# Patient Record
Sex: Female | Born: 1990 | Race: White | Hispanic: No | Marital: Single | Smoking: Current some day smoker
Health system: Southern US, Community
[De-identification: ages and names within clinical notes are randomized; demographics above are authoritative.]

## PROBLEM LIST (undated history)

## (undated) DIAGNOSIS — F1910 Other psychoactive substance abuse, uncomplicated: Secondary | ICD-10-CM

## (undated) DIAGNOSIS — F419 Anxiety disorder, unspecified: Secondary | ICD-10-CM

## (undated) DIAGNOSIS — A5901 Trichomonal vulvovaginitis: Secondary | ICD-10-CM

## (undated) DIAGNOSIS — M719 Bursopathy, unspecified: Secondary | ICD-10-CM

## (undated) DIAGNOSIS — M5136 Other intervertebral disc degeneration, lumbar region: Secondary | ICD-10-CM

## (undated) DIAGNOSIS — G43909 Migraine, unspecified, not intractable, without status migrainosus: Secondary | ICD-10-CM

## (undated) DIAGNOSIS — M51369 Other intervertebral disc degeneration, lumbar region without mention of lumbar back pain or lower extremity pain: Secondary | ICD-10-CM

## (undated) DIAGNOSIS — F319 Bipolar disorder, unspecified: Secondary | ICD-10-CM

## (undated) DIAGNOSIS — M5126 Other intervertebral disc displacement, lumbar region: Secondary | ICD-10-CM

## (undated) DIAGNOSIS — Z87442 Personal history of urinary calculi: Secondary | ICD-10-CM

## (undated) DIAGNOSIS — N289 Disorder of kidney and ureter, unspecified: Secondary | ICD-10-CM

## (undated) HISTORY — DX: Migraine, unspecified, not intractable, without status migrainosus: G43.909

## (undated) HISTORY — DX: Anxiety disorder, unspecified: F41.9

## (undated) HISTORY — DX: Trichomonal vulvovaginitis: A59.01

## (undated) HISTORY — DX: Bipolar disorder, unspecified: F31.9

## (undated) HISTORY — DX: Other psychoactive substance abuse, uncomplicated: F19.10

---
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
## 2011-02-14 ENCOUNTER — Emergency Department: Payer: Self-pay | Admitting: Internal Medicine

## 2011-05-18 ENCOUNTER — Emergency Department: Payer: Self-pay | Admitting: *Deleted

## 2011-09-09 DIAGNOSIS — M797 Fibromyalgia: Secondary | ICD-10-CM | POA: Insufficient documentation

## 2011-10-21 ENCOUNTER — Ambulatory Visit: Payer: Self-pay | Admitting: Family Medicine

## 2012-01-11 ENCOUNTER — Emergency Department: Payer: Self-pay | Admitting: *Deleted

## 2012-03-01 ENCOUNTER — Emergency Department: Payer: Self-pay | Admitting: Emergency Medicine

## 2012-03-09 DIAGNOSIS — M549 Dorsalgia, unspecified: Secondary | ICD-10-CM | POA: Insufficient documentation

## 2012-11-19 ENCOUNTER — Emergency Department: Payer: Self-pay | Admitting: Emergency Medicine

## 2012-11-19 LAB — CBC
HCT: 45.5 % (ref 35.0–47.0)
HGB: 15.4 g/dL (ref 12.0–16.0)
MCH: 31.6 pg (ref 26.0–34.0)
MCHC: 34 g/dL (ref 32.0–36.0)
MCV: 93 fL (ref 80–100)
Platelet: 311 10*3/uL (ref 150–440)
RBC: 4.89 10*6/uL (ref 3.80–5.20)
RDW: 13.2 % (ref 11.5–14.5)
WBC: 25.5 10*3/uL — ABNORMAL HIGH (ref 3.6–11.0)

## 2012-11-19 LAB — BASIC METABOLIC PANEL
Anion Gap: 9 (ref 7–16)
BUN: 9 mg/dL (ref 7–18)
Calcium, Total: 9.9 mg/dL (ref 8.5–10.1)
Chloride: 105 mmol/L (ref 98–107)
Co2: 23 mmol/L (ref 21–32)
Creatinine: 0.83 mg/dL (ref 0.60–1.30)
EGFR (African American): >60
EGFR (Non-African Amer.): >60
Glucose: 123 mg/dL — ABNORMAL HIGH (ref 65–99)
Osmolality: 274 (ref 275–301)
Potassium: 3.8 mmol/L (ref 3.5–5.1)
Sodium: 137 mmol/L (ref 136–145)

## 2012-11-19 LAB — URINALYSIS, COMPLETE
Bilirubin,UR: NEGATIVE
Glucose,UR: NEGATIVE mg/dL (ref 0–75)
Ketone: NEGATIVE
Nitrite: POSITIVE
Ph: 9 (ref 4.5–8.0)
Protein: >=500
RBC,UR: 377 /HPF (ref 0–5)
Specific Gravity: 1.016 (ref 1.003–1.030)
Squamous Epithelial: NONE SEEN
WBC UR: 934 /HPF (ref 0–5)

## 2012-11-22 LAB — URINE CULTURE

## 2012-11-25 LAB — CULTURE, BLOOD (SINGLE)

## 2013-04-09 ENCOUNTER — Emergency Department: Payer: Self-pay | Admitting: Emergency Medicine

## 2013-04-09 LAB — URINALYSIS, COMPLETE
Bilirubin,UR: NEGATIVE
Blood: NEGATIVE
Glucose,UR: NEGATIVE mg/dL (ref 0–75)
Ketone: NEGATIVE
Nitrite: NEGATIVE
Ph: 6 (ref 4.5–8.0)
Protein: NEGATIVE
RBC,UR: 4 /HPF (ref 0–5)
Specific Gravity: 1.023 (ref 1.003–1.030)
Squamous Epithelial: 2
WBC UR: 12 /HPF (ref 0–5)

## 2013-04-09 LAB — WET PREP, GENITAL

## 2013-04-09 LAB — GC/CHLAMYDIA PROBE AMP

## 2013-04-10 DIAGNOSIS — A5901 Trichomonal vulvovaginitis: Secondary | ICD-10-CM

## 2013-04-10 HISTORY — DX: Trichomonal vulvovaginitis: A59.01

## 2013-11-15 DIAGNOSIS — F4322 Adjustment disorder with anxiety: Secondary | ICD-10-CM | POA: Insufficient documentation

## 2014-07-13 DIAGNOSIS — F1321 Sedative, hypnotic or anxiolytic dependence, in remission: Secondary | ICD-10-CM | POA: Insufficient documentation

## 2014-09-11 DIAGNOSIS — Z98891 History of uterine scar from previous surgery: Secondary | ICD-10-CM | POA: Insufficient documentation

## 2015-07-12 ENCOUNTER — Emergency Department: Payer: Medicaid Other

## 2015-07-12 ENCOUNTER — Emergency Department
Admission: EM | Admit: 2015-07-12 | Discharge: 2015-07-13 | Disposition: A | Discharge status: Home / Self Care | Payer: Medicaid Other | Attending: Emergency Medicine | Admitting: Emergency Medicine

## 2015-07-12 ENCOUNTER — Encounter: Payer: Self-pay | Admitting: *Deleted

## 2015-07-12 DIAGNOSIS — Z880 Allergy status to penicillin: Secondary | ICD-10-CM | POA: Diagnosis not present

## 2015-07-12 DIAGNOSIS — Y9289 Other specified places as the place of occurrence of the external cause: Secondary | ICD-10-CM | POA: Diagnosis not present

## 2015-07-12 DIAGNOSIS — S60222A Contusion of left hand, initial encounter: Secondary | ICD-10-CM | POA: Diagnosis not present

## 2015-07-12 DIAGNOSIS — S0501XA Injury of conjunctiva and corneal abrasion without foreign body, right eye, initial encounter: Secondary | ICD-10-CM | POA: Insufficient documentation

## 2015-07-12 DIAGNOSIS — S40022A Contusion of left upper arm, initial encounter: Secondary | ICD-10-CM | POA: Diagnosis not present

## 2015-07-12 DIAGNOSIS — Y9389 Activity, other specified: Secondary | ICD-10-CM | POA: Insufficient documentation

## 2015-07-12 DIAGNOSIS — Y998 Other external cause status: Secondary | ICD-10-CM | POA: Insufficient documentation

## 2015-07-12 DIAGNOSIS — S40021A Contusion of right upper arm, initial encounter: Secondary | ICD-10-CM | POA: Diagnosis not present

## 2015-07-12 DIAGNOSIS — Z91040 Latex allergy status: Secondary | ICD-10-CM | POA: Insufficient documentation

## 2015-07-12 DIAGNOSIS — S60221A Contusion of right hand, initial encounter: Secondary | ICD-10-CM | POA: Insufficient documentation

## 2015-07-12 DIAGNOSIS — S0591XA Unspecified injury of right eye and orbit, initial encounter: Secondary | ICD-10-CM | POA: Diagnosis present

## 2015-07-12 DIAGNOSIS — S0990XA Unspecified injury of head, initial encounter: Secondary | ICD-10-CM | POA: Diagnosis not present

## 2015-07-12 MED ORDER — TETRACAINE HCL 0.5 % OP SOLN
2.0000 [drp] | Freq: Once | OPHTHALMIC | Status: AC
  Administered 2015-07-13: 2 [drp] via OPHTHALMIC
  Filled 2015-07-12: qty 2

## 2015-07-12 MED ORDER — FLUORESCEIN SODIUM 1 MG OP STRP
1.0000 | ORAL_STRIP | Freq: Once | OPHTHALMIC | Status: AC
  Administered 2015-07-13: 1 via OPHTHALMIC
  Filled 2015-07-12: qty 1
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
## 2015-07-13 MED ORDER — KETOROLAC TROMETHAMINE 0.5 % OP SOLN: 1.0000 [drp] | Freq: Four times a day (QID) | OPHTHALMIC | Status: DC

## 2015-07-13 MED ORDER — OXYCODONE-ACETAMINOPHEN 5-325 MG PO TABS
1.0000 | ORAL_TABLET | Freq: Once | ORAL | Status: AC
  Administered 2015-07-13: 1 via ORAL
  Filled 2015-07-13: qty 1

## 2015-07-13 MED ORDER — MELOXICAM 15 MG PO TABS
15.0000 mg | ORAL_TABLET | Freq: Every day | ORAL | Status: DC
Start: 2015-07-13 — End: 2016-04-15

## 2015-07-13 MED ORDER — POLYMYXIN B-TRIMETHOPRIM 10000-0.1 UNIT/ML-% OP SOLN: 2.0000 [drp] | Freq: Four times a day (QID) | OPHTHALMIC | Status: DC

## 2015-07-13 MED ORDER — HYDROCODONE-ACETAMINOPHEN 5-325 MG PO TABS
1.0000 | ORAL_TABLET | Freq: Once | ORAL | Status: DC
  Filled 2015-07-13: qty 1
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
## 2015-07-13 NOTE — Discharge Instructions (Signed)
Contusion °A contusion is a deep bruise. Contusions are the result of a blunt injury to tissues and muscle fibers under the skin. The injury causes bleeding under the skin. The skin overlying the contusion may turn blue, purple, or yellow. Minor injuries will give you a painless contusion, but more severe contusions may stay painful and swollen for a few weeks.  °CAUSES  °This condition is usually caused by a blow, trauma, or direct force to an area of the body. °SYMPTOMS  °Symptoms of this condition include: °· Swelling of the injured area. °· Pain and tenderness in the injured area. °· Discoloration. The area may have redness and then turn blue, purple, or yellow. °DIAGNOSIS  °This condition is diagnosed based on a physical exam and medical history. An X-ray, CT scan, or MRI may be needed to determine if there are any associated injuries, such as broken bones (fractures). °TREATMENT  °Specific treatment for this condition depends on what area of the body was injured. In general, the best treatment for a contusion is resting, icing, applying pressure to (compression), and elevating the injured area. This is often called the RICE strategy. Over-the-counter anti-inflammatory medicines may also be recommended for pain control.  °HOME CARE INSTRUCTIONS  °· Rest the injured area. °· If directed, apply ice to the injured area: °· Put ice in a plastic bag. °· Place a towel between your skin and the bag. °· Leave the ice on for 20 minutes, 2-3 times per day. °· If directed, apply light compression to the injured area using an elastic bandage. Make sure the bandage is not wrapped too tightly. Remove and reapply the bandage as directed by your health care provider. °· If possible, raise (elevate) the injured area above the level of your heart while you are sitting or lying down. °· Take over-the-counter and prescription medicines only as told by your health care provider. °SEEK MEDICAL CARE IF: °· Your symptoms do not  improve after several days of treatment. °· Your symptoms get worse. °· You have difficulty moving the injured area. °SEEK IMMEDIATE MEDICAL CARE IF:  °· You have severe pain. °· You have numbness in a hand or foot. °· Your hand or foot turns pale or cold. °  °This information is not intended to replace advice given to you by your health care provider. Make sure you discuss any questions you have with your health care provider. °  °Document Released: 03/06/2005 Document Revised: 02/15/2015 Document Reviewed: 10/12/2014 °Elsevier Interactive Patient Education ©2016 [employer] ° °Corneal Abrasion °The cornea is the clear covering at the front and center of the eye. When looking at the colored portion of the eye (iris), you are looking through the cornea. This very thin tissue is made up of many layers. The surface layer is a single layer of cells (corneal epithelium) and is one of the most sensitive tissues in the body. If a scratch or injury causes the corneal epithelium to come off, it is called a corneal abrasion. If the injury extends to the tissues below the epithelium, the condition is called a corneal ulcer. °CAUSES  °· Scratches. °· Trauma. °· Foreign body in the eye. °Some people have recurrences of abrasions in the area of the original injury even after it has healed (recurrent erosion syndrome). Recurrent erosion syndrome generally improves and goes away with time. °SYMPTOMS  °· Eye pain. °· Difficulty or inability to keep the injured eye open. °· The eye becomes very sensitive to light. °· Recurrent   erosions tend to happen suddenly, first thing in the morning, usually after waking up and opening the eye. °DIAGNOSIS  °Your health care provider can diagnose a corneal abrasion during an eye exam. Dye is usually placed in the eye using a drop or a small paper strip moistened by your tears. When the eye is examined with a special light, the abrasion shows up clearly because of the dye. °TREATMENT  °· Small  abrasions may be treated with antibiotic drops or ointment alone. °· A pressure patch may be put over the eye. If this is done, follow your doctor's instructions for when to remove the patch. Do not drive or use machines while the eye patch is on. Judging distances is hard to do with a patch on. °If the abrasion becomes infected and spreads to the deeper tissues of the cornea, a corneal ulcer can result. This is serious because it can cause corneal scarring. Corneal scars interfere with light passing through the cornea and cause a loss of vision in the involved eye. °HOME CARE INSTRUCTIONS °· Use medicine or ointment as directed. Only take over-the-counter or prescription medicines for pain, discomfort, or fever as directed by your health care provider. °· Do not drive or operate machinery if your eye is patched. Your ability to judge distances is impaired. °· If your health care provider has given you a follow-up appointment, it is very important to keep that appointment. Not keeping the appointment could result in a severe eye infection or permanent loss of vision. If there is any problem keeping the appointment, let your health care provider know. °SEEK MEDICAL CARE IF:  °· You have pain, light sensitivity, and a scratchy feeling in one eye or both eyes. °· Your pressure patch keeps loosening up, and you can blink your eye under the patch after treatment. °· Any kind of discharge develops from the eye after treatment or if the lids stick together in the morning. °· You have the same symptoms in the morning as you did with the original abrasion days, weeks, or months after the abrasion healed. °  °This information is not intended to replace advice given to you by your health care provider. Make sure you discuss any questions you have with your health care provider. °  °Document Released: 05/24/2000 Document Revised: 02/15/2015 Document Reviewed: 02/01/2013 °Elsevier Interactive Patient Education ©2016 [employer] ° °

## 2015-07-21 ENCOUNTER — Emergency Department
Admission: EM | Admit: 2015-07-21 | Discharge: 2015-07-22 | Disposition: A | Discharge status: Home / Self Care | Payer: Medicaid Other | Attending: Emergency Medicine | Admitting: Emergency Medicine

## 2015-07-21 ENCOUNTER — Encounter: Payer: Self-pay | Admitting: Emergency Medicine

## 2015-07-21 DIAGNOSIS — S060X9D Concussion with loss of consciousness of unspecified duration, subsequent encounter: Secondary | ICD-10-CM | POA: Insufficient documentation

## 2015-07-21 DIAGNOSIS — F10129 Alcohol abuse with intoxication, unspecified: Secondary | ICD-10-CM | POA: Diagnosis not present

## 2015-07-21 DIAGNOSIS — Z880 Allergy status to penicillin: Secondary | ICD-10-CM | POA: Diagnosis not present

## 2015-07-21 DIAGNOSIS — Z791 Long term (current) use of non-steroidal anti-inflammatories (NSAID): Secondary | ICD-10-CM | POA: Insufficient documentation

## 2015-07-21 DIAGNOSIS — S060X9A Concussion with loss of consciousness of unspecified duration, initial encounter: Secondary | ICD-10-CM

## 2015-07-21 DIAGNOSIS — Z91040 Latex allergy status: Secondary | ICD-10-CM | POA: Insufficient documentation

## 2015-07-21 DIAGNOSIS — F17210 Nicotine dependence, cigarettes, uncomplicated: Secondary | ICD-10-CM | POA: Insufficient documentation

## 2015-07-21 DIAGNOSIS — Z79899 Other long term (current) drug therapy: Secondary | ICD-10-CM | POA: Diagnosis not present

## 2015-07-21 DIAGNOSIS — R51 Headache: Secondary | ICD-10-CM | POA: Diagnosis present

## 2015-07-21 DIAGNOSIS — F1410 Cocaine abuse, uncomplicated: Secondary | ICD-10-CM | POA: Insufficient documentation

## 2015-07-21 HISTORY — DX: Other intervertebral disc displacement, lumbar region: M51.26

## 2015-07-21 HISTORY — DX: Bursopathy, unspecified: M71.9

## 2015-07-21 HISTORY — DX: Other intervertebral disc degeneration, lumbar region: M51.36

## 2015-07-21 HISTORY — DX: Other intervertebral disc degeneration, lumbar region without mention of lumbar back pain or lower extremity pain: M51.369

## 2015-07-21 HISTORY — DX: Disorder of kidney and ureter, unspecified: N28.9
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
## 2015-07-22 NOTE — Discharge Instructions (Signed)
Concussion, Adult  A concussion, or closed-head injury, is a brain injury caused by a direct blow to the head or by a quick and sudden movement (jolt) of the head or neck. Concussions are usually not life-threatening. Even so, the effects of a concussion can be serious. If you have had a concussion before, you are more likely to experience concussion-like symptoms after a direct blow to the head.   CAUSES  · Direct blow to the head, such as from running into another player during a soccer game, being hit in a fight, or hitting your head on a hard surface.  · A jolt of the head or neck that causes the brain to move back and forth inside the skull, such as in a car crash.  SIGNS AND SYMPTOMS  The signs of a concussion can be hard to notice. Early on, they may be missed by you, family members, and health care providers. You may look fine but act or feel differently.  Symptoms are usually temporary, but they may last for days, weeks, or even longer. Some symptoms may appear right away while others may not show up for hours or days. Every head injury is different. Symptoms include:  · Mild to moderate headaches that will not go away.  · A feeling of pressure inside your head.  · Having more trouble than usual:    Learning or remembering things you have heard.    Answering questions.    Paying attention or concentrating.    Organizing daily tasks.    Making decisions and solving problems.  · Slowness in thinking, acting or reacting, speaking, or reading.  · Getting lost or being easily confused.  · Feeling tired all the time or lacking energy (fatigued).  · Feeling drowsy.  · Sleep disturbances.    Sleeping more than usual.    Sleeping less than usual.    Trouble falling asleep.    Trouble sleeping (insomnia).  · Loss of balance or feeling lightheaded or dizzy.  · Nausea or vomiting.  · Numbness or tingling.  · Increased sensitivity to:    Sounds.    Lights.    Distractions.  · Vision problems or eyes that tire  easily.  · Diminished sense of taste or smell.  · Ringing in the ears.  · Mood changes such as feeling sad or anxious.  · Becoming easily irritated or angry for little or no reason.  · Lack of motivation.  · Seeing or hearing things other people do not see or hear (hallucinations).  DIAGNOSIS  Your health care provider can usually diagnose a concussion based on a description of your injury and symptoms. He or she will ask whether you passed out (lost consciousness) and whether you are having trouble remembering events that happened right before and during your injury.  Your evaluation might include:  · A brain scan to look for signs of injury to the brain. Even if the test shows no injury, you may still have a concussion.  · Blood tests to be sure other problems are not present.  TREATMENT  · Concussions are usually treated in an emergency department, in urgent care, or at a clinic. You may need to stay in the hospital overnight for further treatment.  · Tell your health care provider if you are taking any medicines, including prescription medicines, over-the-counter medicines, and natural remedies. Some medicines, such as blood thinners (anticoagulants) and aspirin, may increase the chance of complications. Also tell your health care   provider whether you have had alcohol or are taking illegal drugs. This information may affect treatment.  · Your health care provider will send you home with important instructions to follow.  · How fast you will recover from a concussion depends on many factors. These factors include how severe your concussion is, what part of your brain was injured, your age, and how healthy you were before the concussion.  · Most people with mild injuries recover fully. Recovery can take time. In general, recovery is slower in older persons. Also, persons who have had a concussion in the past or have other medical problems may find that it takes longer to recover from their current injury.  HOME  CARE INSTRUCTIONS  General Instructions  · Carefully follow the directions your health care provider gave you.  · Only take over-the-counter or prescription medicines for pain, discomfort, or fever as directed by your health care provider.  · Take only those medicines that your health care provider has approved.  · Do not drink alcohol until your health care provider says you are well enough to do so. Alcohol and certain other drugs may slow your recovery and can put you at risk of further injury.  · If it is harder than usual to remember things, write them down.  · If you are easily distracted, try to do one thing at a time. For example, do not try to watch TV while fixing dinner.  · Talk with family members or close friends when making important decisions.  · Keep all follow-up appointments. Repeated evaluation of your symptoms is recommended for your recovery.  · Watch your symptoms and tell others to do the same. Complications sometimes occur after a concussion. Older adults with a brain injury may have a higher risk of serious complications, such as a blood clot on the brain.  · Tell your teachers, school nurse, school counselor, coach, athletic trainer, or work manager about your injury, symptoms, and restrictions. Tell them about what you can or cannot do. They should watch for:    Increased problems with attention or concentration.    Increased difficulty remembering or learning new information.    Increased time needed to complete tasks or assignments.    Increased irritability or decreased ability to cope with stress.    Increased symptoms.  · Rest. Rest helps the brain to heal. Make sure you:    Get plenty of sleep at night. Avoid staying up late at night.    Keep the same bedtime hours on weekends and weekdays.    Rest during the day. Take daytime naps or rest breaks when you feel tired.  · Limit activities that require a lot of thought or concentration. These include:    Doing homework or job-related  work.    Watching TV.    Working on the computer.  · Avoid any situation where there is potential for another head injury (football, hockey, soccer, basketball, martial arts, downhill snow sports and horseback riding). Your condition will get worse every time you experience a concussion. You should avoid these activities until you are evaluated by the appropriate follow-up health care providers.  Returning To Your Regular Activities  You will need to return to your normal activities slowly, not all at once. You must give your body and brain enough time for recovery.  · Do not return to sports or other athletic activities until your health care provider tells you it is safe to do so.  · Ask   your health care provider when you can drive, ride a bicycle, or operate heavy machinery. Your ability to react may be slower after a brain injury. Never do these activities if you are dizzy.  · Ask your health care provider about when you can return to work or school.  Preventing Another Concussion  It is very important to avoid another brain injury, especially before you have recovered. In rare cases, another injury can lead to permanent brain damage, brain swelling, or death. The risk of this is greatest during the first 7-10 days after a head injury. Avoid injuries by:  · Wearing a seat belt when riding in a car.  · Drinking alcohol only in moderation.  · Wearing a helmet when biking, skiing, skateboarding, skating, or doing similar activities.  · Avoiding activities that could lead to a second concussion, such as contact or recreational sports, until your health care provider says it is okay.  · Taking safety measures in your home.    Remove clutter and tripping hazards from floors and stairways.    Use grab bars in bathrooms and handrails by stairs.    Place non-slip mats on floors and in bathtubs.    Improve lighting in dim areas.  SEEK MEDICAL CARE IF:  · You have increased problems paying attention or  concentrating.  · You have increased difficulty remembering or learning new information.  · You need more time to complete tasks or assignments than before.  · You have increased irritability or decreased ability to cope with stress.  · You have more symptoms than before.  Seek medical care if you have any of the following symptoms for more than 2 weeks after your injury:  · Lasting (chronic) headaches.  · Dizziness or balance problems.  · Nausea.  · Vision problems.  · Increased sensitivity to noise or light.  · Depression or mood swings.  · Anxiety or irritability.  · Memory problems.  · Difficulty concentrating or paying attention.  · Sleep problems.  · Feeling tired all the time.  SEEK IMMEDIATE MEDICAL CARE IF:  · You have severe or worsening headaches. These may be a sign of a blood clot in the brain.  · You have weakness (even if only in one hand, leg, or part of the face).  · You have numbness.  · You have decreased coordination.  · You vomit repeatedly.  · You have increased sleepiness.  · One pupil is larger than the other.  · You have convulsions.  · You have slurred speech.  · You have increased confusion. This may be a sign of a blood clot in the brain.  · You have increased restlessness, agitation, or irritability.  · You are unable to recognize people or places.  · You have neck pain.  · It is difficult to wake you up.  · You have unusual behavior changes.  · You lose consciousness.  MAKE SURE YOU:  · Understand these instructions.  · Will watch your condition.  · Will get help right away if you are not doing well or get worse.     This information is not intended to replace advice given to you by your health care provider. Make sure you discuss any questions you have with your health care provider.     Document Released: 08/17/2003 Document Revised: 06/17/2014 Document Reviewed: 12/17/2012  Elsevier Interactive Patient Education ©2016 [employer]

## 2015-07-22 NOTE — ED Notes (Signed)
Upon entering room pt. Sleeping in bed.  Pt. States she has had HA and blurry vision for the past 5-6 days.  Pt. Also complains of body aches.  Pt. States she was dx with a coronal abrasion.

## 2015-07-22 NOTE — ED Notes (Signed)
Pt. States she was unable to pick up medications for eye due to cost.

## 2015-07-22 NOTE — ED Notes (Signed)
Pt. Requested to use phone after doctor examination.  After phone call pt. Stated she needed to go home.  Pt. Stated, her childs father would be picking her up in waiting room to take her home.

## 2015-07-22 NOTE — ED Notes (Signed)
Pt. Stated she did not need discharge instructions.
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
## 2016-02-08 ENCOUNTER — Ambulatory Visit: Payer: Medicaid Other | Admitting: Licensed Clinical Social Worker

## 2016-04-01 ENCOUNTER — Telehealth: Payer: Self-pay

## 2016-04-01 NOTE — Telephone Encounter (Signed)
Pt calls and states that she has an appointment with is Wednesday for NOB intake but today has noticed some pink colored discharge after using the bathroom. Pt states that she is concerned because she did not have this in her previous pregnancies. Pt denies pain. Advised pt on implantation bleeding as well as first trimester bleeding. Advised pt to seek care in the ED if she experienced severe cramping or period like bleeding.

## 2016-04-15 ENCOUNTER — Ambulatory Visit (INDEPENDENT_AMBULATORY_CARE_PROVIDER_SITE_OTHER): Payer: Medicaid Other | Admitting: Obstetrics and Gynecology

## 2016-04-15 VITALS — BP 115/65 | HR 83 | Wt 143.3 lb

## 2016-04-15 DIAGNOSIS — Z1389 Encounter for screening for other disorder: Secondary | ICD-10-CM

## 2016-04-15 DIAGNOSIS — Z3492 Encounter for supervision of normal pregnancy, unspecified, second trimester: Secondary | ICD-10-CM

## 2016-04-15 DIAGNOSIS — Z331 Pregnant state, incidental: Secondary | ICD-10-CM

## 2016-04-15 DIAGNOSIS — N912 Amenorrhea, unspecified: Secondary | ICD-10-CM

## 2016-04-15 DIAGNOSIS — Z3687 Encounter for antenatal screening for uncertain dates: Secondary | ICD-10-CM

## 2016-04-15 DIAGNOSIS — Z369 Encounter for antenatal screening, unspecified: Secondary | ICD-10-CM

## 2016-04-15 DIAGNOSIS — Z113 Encounter for screening for infections with a predominantly sexual mode of transmission: Secondary | ICD-10-CM

## 2016-04-15 LAB — POCT URINE PREGNANCY: Preg Test, Ur: POSITIVE — AB

## 2016-04-15 NOTE — Progress Notes (Signed)
Linda Thornton presents for NOB nurse interview visit. Pregnancy confirmation done at [facility] but did not bring it with her. UPT: POSITIVE. This was done here at office.  G-3.  P-2002  Pregnancy education material explained and given. No cats in the home. NOB labs ordered.  HIV labs and Drug screen were explained optional and she did not decline. Drug screen ordered.  PNV encouraged. MaternIT genetic test done today. Pt's maternal aunt has a child with cerebral palsy and he is deaf. FOB previous wife had a still birth at term.  Pt. To follow up with provider in as soon as available for NOB physical.  Pt had previously rescheduled x2 and now she is 12.4wks today by LMP: 01/18/2016 (approximate). Ultrasound ordered for dating and viability because pt is unsure of LMP. All questions answered.

## 2016-04-15 NOTE — Patient Instructions (Signed)
Pregnancy and Zika Virus Disease Zika virus disease, or Zika, is an illness that can spread to people from mosquitoes that carry the virus. It may also spread from person to person through infected body fluids. Zika first occurred in [place], but recently it has spread to new areas. The virus occurs in tropical climates. The location of Zika continues to change. Most people who become infected with Zika virus do not develop serious illness. However, Zika may cause birth defects in an unborn baby whose mother is infected with the virus. It may also increase the risk of miscarriage. WHAT ARE THE SYMPTOMS OF ZIKA VIRUS DISEASE? In many cases, people who have been infected with Zika virus do not develop any symptoms. If symptoms appear, they usually start about a week after the person is infected. Symptoms are usually mild. They may include:  Fever.  Rash.  Red eyes.  Joint pain. HOW DOES ZIKA VIRUS DISEASE SPREAD? The main way that Zika virus spreads is through the bite of a certain type of mosquito. Unlike most types of mosquitos, which bite only at night, the type of mosquito that carries Zika virus bites both at night and during the day. Zika virus can also spread through sexual contact, through a blood transfusion, and from a mother to her baby before or during birth. Once you have had Zika virus disease, it is unlikely that you will get it again. CAN I PASS ZIKA TO MY BABY DURING PREGNANCY? Yes, Zika can pass from a mother to her baby before or during birth. WHAT PROBLEMS CAN ZIKA CAUSE FOR MY BABY? A woman who is infected with Zika virus while pregnant is at risk of having her baby born with a condition in which the brain or head is smaller than expected (microcephaly). Babies who have microcephaly can have developmental delays, seizures, hearing problems, and vision problems. Having Zika virus disease during pregnancy can also increase the risk of miscarriage. HOW CAN ZIKA VIRUS DISEASE BE  PREVENTED? There is no vaccine to prevent Zika. The best way to prevent the disease is to avoid infected mosquitoes and avoid exposure to body fluids that can spread the virus. Avoid any possible exposure to Zika by taking the following precautions. For women and their sex partners:  Avoid traveling to high-risk areas. The locations where Zika is being reported change often. To identify high-risk areas, check the CDC travel website: www.cdc.gov/zika/geo/index.html  If you or your sex partner must travel to a high-risk area, talk with a health care provider before and after traveling.  Take all precautions to avoid mosquito bites if you live in, or travel to, any of the high-risk areas. Insect repellents are safe to use during pregnancy.  Ask your health care provider when it is safe to have sexual contact. For women:  If you are pregnant or trying to become pregnant, avoid sexual contact with persons who may have been exposed to Zika virus, persons who have possible symptoms of Zika, or persons whose history you are unsure about. If you choose to have sexual contact with someone who may have been exposed to Zika virus, use condoms correctly during the entire duration of sexual activity, every time. Do not share sexual devices, as you may be exposed to body fluids.  Ask your health care provider about when it is safe to attempt pregnancy after a possible exposure to Zika virus. WHAT STEPS SHOULD I TAKE TO AVOID MOSQUITO BITES? Take these steps to avoid mosquito bites when you are   in a high-risk area:  Wear loose clothing that covers your arms and legs.  Limit your outdoor activities.  Do not open windows unless they have window screens.  Sleep under mosquito nets.  Use insect repellent. The best insect repellents have:  DEET, picaridin, oil of lemon eucalyptus (OLE), or [id number] in them.  Higher amounts of an active ingredient in them.  Remember that insect repellents are safe to use  during pregnancy.  Do not use OLE on children who are younger than 3 years of age. Do not use insect repellent on babies who are younger than 2 months of age.  Cover your child's stroller with mosquito netting. Make sure the netting fits snugly and that any loose netting does not cover your child's mouth or nose. Do not use a blanket as a mosquito-protection cover.  Do not apply insect repellent underneath clothing.  If you are using sunscreen, apply the sunscreen before applying the insect repellent.  Treat clothing with permethrin. Do not apply permethrin directly to your skin. Follow label directions for safe use.  Get rid of standing water, where mosquitoes may reproduce. Standing water is often found in items such as buckets, bowls, animal food dishes, and flowerpots. When you return from traveling to any high-risk area, continue taking actions to protect yourself against mosquito bites for 3 weeks, even if you show no signs of illness. This will prevent spreading Zika virus to uninfected mosquitoes. WHAT SHOULD I KNOW ABOUT THE SEXUAL TRANSMISSION OF ZIKA? People can spread Zika to their sexual partners during vaginal, anal, or oral sex, or by sharing sexual devices. Many people with Zika do not develop symptoms, so a person could spread the disease without knowing that they are infected. The greatest risk is to women who are pregnant or who may become pregnant. Zika virus can live longer in semen than it can live in blood. Couples can prevent sexual transmission of the virus by:  Using condoms correctly during the entire duration of sexual activity, every time. This includes vaginal, anal, and oral sex.  Not sharing sexual devices. Sharing increases your risk of being exposed to body fluid from another person.  Avoiding all sexual activity until your health care provider says it is safe. SHOULD I BE TESTED FOR ZIKA VIRUS? A sample of your blood can be tested for Zika virus. A pregnant  woman should be tested if she may have been exposed to the virus or if she has symptoms of Zika. She may also have additional tests done during her pregnancy, such ultrasound testing. Talk with your health care provider about which tests are recommended.   This information is not intended to replace advice given to you by your health care provider. Make sure you discuss any questions you have with your health care provider.   Document Released: 02/15/2015 Document Reviewed: 02/08/2015 Elsevier Interactive Patient Education 2016 [employer] Minor Illnesses and Medications in Pregnancy  Cold/Flu:  Sudafed for congestion- Robitussin (plain) for cough- Tylenol for discomfort.  Please follow the directions on the label.  Try not to take any more than needed.  OTC Saline nasal spray and air humidifier or cool-mist  Vaporizer to sooth nasal irritation and to loosen congestion.  It is also important to increase intake of non carbonated fluids, especially if you have a fever.  Constipation:  Colace-2 capsules at bedtime; Metamucil- follow directions on label; Senokot- 1 tablet at bedtime.  Any one of these medications can be used.  It is also   very important to increase fluids and fruits along with regular exercise.  If problem persists please call the office.  Diarrhea:  Kaopectate as directed on the label.  Eat a bland diet and increase fluids.  Avoid highly seasoned foods.  Headache:  Tylenol 1 or 2 tablets every 3-4 hours as needed  Indigestion:  Maalox, Mylanta, Tums or Rolaids- as directed on label.  Also try to eat small meals and avoid fatty, greasy or spicy foods.  Nausea with or without Vomiting:  Nausea in pregnancy is caused by increased levels of hormones in the body which influence the digestive system and cause irritation when stomach acids accumulate.  Symptoms usually subside after 1st trimester of pregnancy.  Try the following: 1. Keep saltines, graham crackers or dry toast by your bed  to eat upon awakening. 2. Don't let your stomach get empty.  Try to eat 5-6 small meals per day instead of 3 large ones. 3. Avoid greasy fatty or highly seasoned foods.  4. Take OTC Unisom 1 tablet at bed time along with OTC Vitamin B6 25-50 mg 3 times per day.    If nausea continues with vomiting and you are unable to keep down food and fluids you may need a prescription medication.  Please notify your provider.   Sore throat:  Chloraseptic spray, throat lozenges and or plain Tylenol.  Vaginal Yeast Infection:  OTC Monistat for 7 days as directed on label.  If symptoms do not resolve within a week notify provider.  If any of the above problems do not subside with recommended treatment please call the office for further assistance.   Do not take Aspirin, Advil, Motrin or Ibuprofen.  * * OTC= Over the counter Hyperemesis Gravidarum Hyperemesis gravidarum is a severe form of nausea and vomiting that happens during pregnancy. Hyperemesis is worse than morning sickness. It may cause you to have nausea or vomiting all day for many days. It may keep you from eating and drinking enough food and liquids. Hyperemesis usually occurs during the first half (the first 20 weeks) of pregnancy. It often goes away once a woman is in her second half of pregnancy. However, sometimes hyperemesis continues through an entire pregnancy.  CAUSES  The cause of this condition is not completely known but is thought to be related to changes in the body's hormones when pregnant. It could be from the high level of the pregnancy hormone or an increase in estrogen in the body.  SIGNS AND SYMPTOMS   Severe nausea and vomiting.  Nausea that does not go away.  Vomiting that does not allow you to keep any food down.  Weight loss and body fluid loss (dehydration).  Having no desire to eat or not liking food you have previously enjoyed. DIAGNOSIS  Your health care provider will do a physical exam and ask you about your  symptoms. He or she may also order blood tests and urine tests to make sure something else is not causing the problem.  TREATMENT  You may only need medicine to control the problem. If medicines do not control the nausea and vomiting, you will be treated in the hospital to prevent dehydration, increased acid in the blood (acidosis), weight loss, and changes in the electrolytes in your body that may harm the unborn baby (fetus). You may need IV fluids.  HOME CARE INSTRUCTIONS   Only take over-the-counter or prescription medicines as directed by your health care provider.  Try eating a couple of dry crackers or   toast in the morning before getting out of bed.  Avoid foods and smells that upset your stomach.  Avoid fatty and spicy foods.  Eat 5-6 small meals a day.  Do not drink when eating meals. Drink between meals.  For snacks, eat high-protein foods, such as cheese.  Eat or suck on things that have ginger in them. Ginger helps nausea.  Avoid food preparation. The smell of food can spoil your appetite.  Avoid iron pills and iron in your multivitamins until after 3-4 months of being pregnant. However, consult with your health care provider before stopping any prescribed iron pills. SEEK MEDICAL CARE IF:   Your abdominal pain increases.  You have a severe headache.  You have vision problems.  You are losing weight. SEEK IMMEDIATE MEDICAL CARE IF:   You are unable to keep fluids down.  You vomit blood.  You have constant nausea and vomiting.  You have excessive weakness.  You have extreme thirst.  You have dizziness or fainting.  You have a fever or persistent symptoms for more than 2-3 days.  You have a fever and your symptoms suddenly get worse. MAKE SURE YOU:   Understand these instructions.  Will watch your condition.  Will get help right away if you are not doing well or get worse.   This information is not intended to replace advice given to you by your  health care provider. Make sure you discuss any questions you have with your health care provider.   Document Released: 05/27/2005 Document Revised: 03/17/2013 Document Reviewed: 01/06/2013 Elsevier Interactive Patient Education 2016 [employer] Commonly Asked Questions During Pregnancy  Cats: A parasite can be excreted in cat feces.  To avoid exposure you need to have another person empty the little box.  If you must empty the litter box you will need to wear gloves.  Wash your hands after handling your cat.  This parasite can also be found in raw or undercooked meat so this should also be avoided.  Colds, Sore Throats, Flu: Please check your medication sheet to see what you can take for symptoms.  If your symptoms are unrelieved by these medications please call the office.  Dental Work: Most any dental work your dentist recommends is permitted.  X-rays should only be taken during the first trimester if absolutely necessary.  Your abdomen should be shielded with a lead apron during all x-rays.  Please notify your provider prior to receiving any x-rays.  Novocaine is fine; gas is not recommended.  If your dentist requires a note from us prior to dental work please call the office and we will provide one for you.  Exercise: Exercise is an important part of staying healthy during your pregnancy.  You may continue most exercises you were accustomed to prior to pregnancy.  Later in your pregnancy you will most likely notice you have difficulty with activities requiring balance like riding a bicycle.  It is important that you listen to your body and avoid activities that put you at a higher risk of falling.  Adequate rest and staying well hydrated are a must!  If you have questions about the safety of specific activities ask your provider.    Exposure to Children with illness: Try to avoid obvious exposure; report any symptoms to us when noted,  If you have chicken pos, red measles or mumps, you should  be immune to these diseases.   Please do not take any vaccines while pregnant unless you have checked with   your OB provider.  Fetal Movement: After 28 weeks we recommend you do "kick counts" twice daily.  Lie or sit down in a calm quiet environment and count your baby movements "kicks".  You should feel your baby at least 10 times per hour.  If you have not felt 10 kicks within the first hour get up, walk around and have something sweet to eat or drink then repeat for an additional hour.  If count remains less than 10 per hour notify your provider.  Fumigating: Follow your pest control agent's advice as to how long to stay out of your home.  Ventilate the area well before re-entering.  Hemorrhoids:   Most over-the-counter preparations can be used during pregnancy.  Check your medication to see what is safe to use.  It is important to use a stool softener or fiber in your diet and to drink lots of liquids.  If hemorrhoids seem to be getting worse please call the office.   Hot Tubs:  Hot tubs Jacuzzis and saunas are not recommended while pregnant.  These increase your internal body temperature and should be avoided.  Intercourse:  Sexual intercourse is safe during pregnancy as long as you are comfortable, unless otherwise advised by your provider.  Spotting may occur after intercourse; report any bright red bleeding that is heavier than spotting.  Labor:  If you know that you are in labor, please go to the hospital.  If you are unsure, please call the office and let us help you decide what to do.  Lifting, straining, etc:  If your job requires heavy lifting or straining please check with your provider for any limitations.  Generally, you should not lift items heavier than that you can lift simply with your hands and arms (no back muscles)  Painting:  Paint fumes do not harm your pregnancy, but may make you ill and should be avoided if possible.  Latex or water based paints have less odor than oils.   Use adequate ventilation while painting.  Permanents & Hair Color:  Chemicals in hair dyes are not recommended as they cause increase hair dryness which can increase hair loss during pregnancy.  " Highlighting" and permanents are allowed.  Dye may be absorbed differently and permanents may not hold as well during pregnancy.  Sunbathing:  Use a sunscreen, as skin burns easily during pregnancy.  Drink plenty of fluids; avoid over heating.  Tanning Beds:  Because their possible side effects are still unknown, tanning beds are not recommended.  Ultrasound Scans:  Routine ultrasounds are performed at approximately 20 weeks.  You will be able to see your baby's general anatomy an if you would like to know the gender this can usually be determined as well.  If it is questionable when you conceived you may also receive an ultrasound early in your pregnancy for dating purposes.  Otherwise ultrasound exams are not routinely performed unless there is a medical necessity.  Although you can request a scan we ask that you pay for it when conducted because insurance does not cover " patient request" scans.  Work: If your pregnancy proceeds without complications you may work until your due date, unless your physician or employer advises otherwise.  Round Ligament Pain/Pelvic Discomfort:  Sharp, shooting pains not associated with bleeding are fairly common, usually occurring in the second trimester of pregnancy.  They tend to be worse when standing up or when you remain standing for long periods of time.  These are the result   of pressure of certain pelvic ligaments called "round ligaments".  Rest, Tylenol and heat seem to be the most effective relief.  As the womb and fetus grow, they rise out of the pelvis and the discomfort improves.  Please notify the office if your pain seems different than that described.  It may represent a more serious condition.   

## 2016-04-16 ENCOUNTER — Encounter: Payer: Self-pay | Admitting: Obstetrics and Gynecology

## 2016-04-16 LAB — CBC WITH DIFFERENTIAL/PLATELET
Basophils Absolute: 0 10*3/uL (ref 0.0–0.2)
Basos: 0 %
EOS (ABSOLUTE): 0.1 10*3/uL (ref 0.0–0.4)
Eos: 1 %
Hematocrit: 37.8 % (ref 34.0–46.6)
Hemoglobin: 12.9 g/dL (ref 11.1–15.9)
Immature Grans (Abs): 0 10*3/uL (ref 0.0–0.1)
Immature Granulocytes: 0 %
Lymphocytes Absolute: 2.2 10*3/uL (ref 0.7–3.1)
Lymphs: 17 %
MCH: 31 pg (ref 26.6–33.0)
MCHC: 34.1 g/dL (ref 31.5–35.7)
MCV: 91 fL (ref 79–97)
Monocytes Absolute: 0.8 10*3/uL (ref 0.1–0.9)
Monocytes: 6 %
Neutrophils Absolute: 10.1 10*3/uL — ABNORMAL HIGH (ref 1.4–7.0)
Neutrophils: 76 %
Platelets: 287 10*3/uL (ref 150–379)
RBC: 4.16 x10E6/uL (ref 3.77–5.28)
RDW: 13.5 % (ref 12.3–15.4)
WBC: 13.2 10*3/uL — ABNORMAL HIGH (ref 3.4–10.8)

## 2016-04-16 LAB — GC/CHLAMYDIA PROBE AMP
Chlamydia trachomatis, NAA: NEGATIVE
Neisseria gonorrhoeae by PCR: NEGATIVE

## 2016-04-16 LAB — RUBELLA SCREEN: Rubella Antibodies, IGG: 2.19 index (ref >0.99)

## 2016-04-16 LAB — ABO

## 2016-04-16 LAB — ANTIBODY SCREEN: Antibody Screen: NEGATIVE

## 2016-04-16 LAB — HEPATITIS B SURFACE ANTIGEN: Hepatitis B Surface Ag: NEGATIVE

## 2016-04-16 LAB — RH TYPE: Rh Factor: POSITIVE

## 2016-04-16 LAB — VARICELLA ZOSTER ANTIBODY, IGG: Varicella zoster IgG: 450 index (ref >165)

## 2016-04-16 LAB — RPR: RPR Ser Ql: NONREACTIVE

## 2016-04-16 LAB — HIV ANTIBODY (ROUTINE TESTING W REFLEX): HIV Screen 4th Generation wRfx: NONREACTIVE

## 2016-04-17 LAB — URINALYSIS, ROUTINE W REFLEX MICROSCOPIC
Bilirubin, UA: NEGATIVE
Glucose, UA: NEGATIVE
Ketones, UA: NEGATIVE
Leukocytes, UA: NEGATIVE
Nitrite, UA: NEGATIVE
Protein, UA: NEGATIVE
RBC, UA: NEGATIVE
Specific Gravity, UA: 1.021 (ref 1.005–1.030)
Urobilinogen, Ur: 0.2 mg/dL (ref 0.2–1.0)
pH, UA: 7.5 (ref 5.0–7.5)

## 2016-04-17 LAB — MONITOR DRUG PROFILE 14(MW)
Amphetamine Scrn, Ur: NEGATIVE ng/mL
BARBITURATE SCREEN URINE: NEGATIVE ng/mL
BENZODIAZEPINE SCREEN, URINE: NEGATIVE ng/mL
Buprenorphine, Urine: NEGATIVE ng/mL
CANNABINOIDS UR QL SCN: NEGATIVE ng/mL
Cocaine (Metab) Scrn, Ur: NEGATIVE ng/mL
Creatinine(Crt), U: 130.8 mg/dL (ref 20.0–300.0)
Fentanyl, Urine: NEGATIVE pg/mL
Meperidine Screen, Urine: NEGATIVE ng/mL
Methadone Screen, Urine: NEGATIVE ng/mL
OXYCODONE+OXYMORPHONE UR QL SCN: NEGATIVE ng/mL
Opiate Scrn, Ur: NEGATIVE ng/mL
Ph of Urine: 7.2 (ref 4.5–8.9)
Phencyclidine Qn, Ur: NEGATIVE ng/mL
Propoxyphene Scrn, Ur: NEGATIVE ng/mL
SPECIFIC GRAVITY: 1.02
Tramadol Screen, Urine: NEGATIVE ng/mL

## 2016-04-17 LAB — URINE CULTURE, OB REFLEX

## 2016-04-17 LAB — NICOTINE SCREEN, URINE: Cotinine Ql Scrn, Ur: POSITIVE ng/mL

## 2016-04-17 LAB — CULTURE, OB URINE

## 2016-04-18 ENCOUNTER — Telehealth: Payer: Self-pay | Admitting: Obstetrics and Gynecology

## 2016-04-18 NOTE — Telephone Encounter (Signed)
Called pt she questions what her varicella results mean, advised pt that she is immune to the virus based on her labs. Pt gave verbal understanding

## 2016-04-18 NOTE — Telephone Encounter (Signed)
Pt called and she saw her labs on her my chart and she had a question about the varacella results, she hasn't seen dr Valentino Saxoncherry yet but she will be.

## 2016-04-23 ENCOUNTER — Other Ambulatory Visit: Payer: Self-pay

## 2016-04-23 ENCOUNTER — Telehealth: Payer: Self-pay | Admitting: Obstetrics and Gynecology

## 2016-04-23 NOTE — Telephone Encounter (Signed)
Pt called and she was looking at her my chart message and she saw that there were a lot of results that are back and she was wanting the results she could not tell on her my chart what the results are.

## 2016-04-23 NOTE — Telephone Encounter (Signed)
Called pt no answer. LM for pt to call back.  

## 2016-04-23 NOTE — Telephone Encounter (Signed)
Pt calls back questions if MaterniT results are back, advised pt they were not and we would call her as soon as we get them in. Pt gave verbal understanding.

## 2016-04-24 LAB — MATERNIT21  PLUS CORE+ESS+SCA, BLOOD
Chromosome 13: NEGATIVE
Chromosome 18: NEGATIVE
Chromosome 21: NEGATIVE
Y Chromosome: DETECTED

## 2016-04-25 ENCOUNTER — Ambulatory Visit (INDEPENDENT_AMBULATORY_CARE_PROVIDER_SITE_OTHER): Payer: Medicaid Other | Admitting: Obstetrics and Gynecology

## 2016-04-25 ENCOUNTER — Telehealth: Payer: Self-pay | Admitting: Obstetrics and Gynecology

## 2016-04-25 ENCOUNTER — Ambulatory Visit (INDEPENDENT_AMBULATORY_CARE_PROVIDER_SITE_OTHER): Payer: Medicaid Other

## 2016-04-25 VITALS — BP 107/66 | HR 86 | Wt 150.5 lb

## 2016-04-25 DIAGNOSIS — Z124 Encounter for screening for malignant neoplasm of cervix: Secondary | ICD-10-CM

## 2016-04-25 DIAGNOSIS — Z720 Tobacco use: Secondary | ICD-10-CM

## 2016-04-25 DIAGNOSIS — F4322 Adjustment disorder with anxiety: Secondary | ICD-10-CM

## 2016-04-25 DIAGNOSIS — Z3482 Encounter for supervision of other normal pregnancy, second trimester: Secondary | ICD-10-CM | POA: Diagnosis not present

## 2016-04-25 DIAGNOSIS — Z369 Encounter for antenatal screening, unspecified: Secondary | ICD-10-CM

## 2016-04-25 DIAGNOSIS — O2601 Excessive weight gain in pregnancy, first trimester: Secondary | ICD-10-CM

## 2016-04-25 DIAGNOSIS — E663 Overweight: Secondary | ICD-10-CM

## 2016-04-25 DIAGNOSIS — Z98891 History of uterine scar from previous surgery: Secondary | ICD-10-CM

## 2016-04-25 DIAGNOSIS — Z3687 Encounter for antenatal screening for uncertain dates: Secondary | ICD-10-CM | POA: Diagnosis not present

## 2016-04-25 DIAGNOSIS — Z3682 Encounter for antenatal screening for nuchal translucency: Secondary | ICD-10-CM | POA: Diagnosis not present

## 2016-04-25 LAB — POCT URINALYSIS DIPSTICK
Bilirubin, UA: NEGATIVE
Blood, UA: NEGATIVE
Glucose, UA: NEGATIVE
Ketones, UA: NEGATIVE
Leukocytes, UA: NEGATIVE
Nitrite, UA: NEGATIVE
Protein, UA: NEGATIVE
Spec Grav, UA: 1.01
Urobilinogen, UA: NEGATIVE
pH, UA: 7

## 2016-04-25 NOTE — Progress Notes (Signed)
OBSTETRIC INITIAL PRENATAL VISIT  Subjective:    Linda Thornton is being seen today for her first obstetrical visit.  This is a planned pregnancy. She is a 25 y.o. G59P2002 female at [redacted]w[redacted]d gestation, Estimated Date of Delivery: 10/24/16 with last menstrual period 01/18/2016 (approximate). Her obstetrical history is significant for prior C-section x 2. Relationship with FOB: significant other, living together. Patient does intend to breast feed. Pregnancy history fully reviewed.   Obstetric History   G3   P2   T2   P0   A0   L2    SAB0   TAB0   Ectopic0   Multiple0   Live Births2     # Outcome Date GA Lbr Len/2nd Weight Sex Delivery Anes PTL Lv  3 Current           2 Term 10/12/14 [redacted]w[redacted]d  7 lb 6 oz (3.345 kg) M CS-Unspec  N LIV  1 Term 05/26/09 [redacted]w[redacted]d  7 lb 6.5 oz (3.359 kg) M CS-Unspec  N LIV    Obstetric Comments  G1 - C-section for FTP (only dilated 1.5 cm, failed IOL).   G2- performed for repeat only    Gynecologic History:  Last pap smear was ~ 2013 or 2014.  Results were normal.  Denies h/o abnormal pap smears in the past.  Denies history of STIs.  Contraception: None   Past Medical History:  Diagnosis Date  . Bulging lumbar disc   . Bursitis   . Migraine headache   . Renal disorder    kidney stone     Family History  Problem Relation Age of Onset  . Irregular heart beat Mother   . Hypertension Mother   . Migraines Mother   . Rheum arthritis Maternal Grandmother   . Osteoarthritis Maternal Grandmother   . Diabetes Maternal Grandfather   . Rheum arthritis Maternal Grandfather   . Osteoarthritis Maternal Grandfather      Past Surgical History:  Procedure Laterality Date  . CESAREAN SECTION      Social History   Social History  . Marital status: Single    Spouse name: N/A  . Number of children: N/A  . Years of education: N/A   Occupational History  . Not on file.   Social History Main Topics  . Smoking status: Current Some Day Smoker   Packs/day: 0.50    Types: Cigarettes  . Smokeless tobacco: Never Used  . Alcohol use No  . Drug use: No  . Sexual activity: Yes    Partners: Male    Birth control/ protection: OCP     Comment: tri-low sprintec   Other Topics Concern  . Not on file   Social History Narrative  . No narrative on file     Current Outpatient Prescriptions on File Prior to Visit  Medication Sig Dispense Refill  . Prenatal Multivit-Min-Fe-FA (PRENATAL VITAMINS PO) Take 1 tablet by mouth daily.     No current facility-administered medications on file prior to visit.      Allergies  Allergen Reactions  . Hydrocodone-Acetaminophen Itching  . Latex Rash  . Penicillins Rash     Review of Systems General:Not Present- Fever, Weight Loss and Weight Gain. Skin:Not Present- Rash. HEENT:Not Present- Blurred Vision, Headache and Bleeding Gums. Respiratory:Not Present- Difficulty Breathing. Breast:Not Present- Breast Mass. Cardiovascular:Not Present- Chest Pain, Elevated Blood Pressure, Fainting / Blacking Out and Shortness of Breath. Gastrointestinal: Present - Nausea (mild). Not Present- Abdominal Pain, Constipation, and Vomiting. Female Genitourinary:Not Present- Frequency,  Painful Urination, Pelvic Pain, Vaginal Bleeding, Vaginal Discharge, Contractions, regular, Fetal Movements Decreased, Urinary Complaints and Vaginal Fluid. Musculoskeletal:Not Present- Back Pain and Leg Cramps. Neurological:Not Present- Dizziness. Psychiatric:Not Present- Depression.     Objective:   Blood pressure 107/66, pulse 86, weight 150 lb 8 oz (68.3 kg), last menstrual period 01/18/2016.  Body mass index is 28.44 kg/m.  General Appearance:    Alert, cooperative, no distress, appears stated age, overweight  Head:    Normocephalic, without obvious abnormality, atraumatic  Eyes:    PERRL, conjunctiva/corneas clear, EOM's intact, both eyes  Ears:    Normal external ear canals, both ears  Nose:   Nares  normal, septum midline, mucosa normal, no drainage or sinus tenderness  Throat:   Lips, mucosa, and tongue normal; teeth and gums normal  Neck:   Supple, symmetrical, trachea midline, no adenopathy; thyroid: no enlargement/tenderness/nodules; no carotid bruit or JVD  Back:     Symmetric, no curvature, ROM normal, no CVA tenderness  Lungs:     Clear to auscultation bilaterally, respirations unlabored  Chest Wall:    No tenderness or deformity   Heart:    Regular rate and rhythm, S1 and S2 normal, no murmur, rub or gallop  Breast Exam:    No tenderness, masses, or nipple abnormality  Abdomen:     Soft, non-tender, bowel sounds active all four quadrants, no masses, no organomegaly.  FH 14.  FHT 162 bpm.  Well healed Pfannenstiel incision.   Genitalia:    Pelvic:external genitalia normal, vagina without lesions, discharge, or tenderness, rectovaginal septum  normal. Cervix normal in appearance, no cervical motion tenderness, no adnexal masses or tenderness.  Pregnancy positive findings: uterine enlargement: 13-14 wk size, nontender.   Rectal:    Normal external sphincter.  No hemorrhoids appreciated. Internal exam not done.   Extremities:   Extremities normal, atraumatic, no cyanosis or edema  Pulses:   2+ and symmetric all extremities  Skin:   Skin color, texture, turgor normal, no rashes or lesions  Lymph nodes:   Cervical, supraclavicular, and axillary nodes normal  Neurologic:   CNII-XII intact, normal strength, sensation and reflexes throughout    Assessment:   Pregnancy at 14 and 2/7 weeks   Overweight H/o prior C-section x 2 Tobacco abuse H/o anxiety Excessive weight gain in 1st trimester  Plan:   1. Pregnancy at 14 and 2/7 weeks  - Initial labs reviewed. - Pap smear performed today. - Prenatal vitamins encouraged. - Problem list reviewed and updated. - New OB counseling:  The patient has been given an overview regarding routine prenatal care.   - Prenatal testing, optional  genetic testing, and ultrasound use in pregnancy were reviewed.  Ultrasound ordered to confirm dating. - MaterniT21 previously performed, normal female.  - Benefits of Breast Feeding were discussed. The patient is encouraged to consider nursing her baby post partum.  2. Overweight  - Recommendations regarding diet, weight gain, and exercise in pregnancy were given.  Patient has already gained ~ 20 lbs since onset of pregnancy.    3. H/o prior C-section x 2 - Reviewed reason for h/o C-sections, initial performed due to failed IOL at 41 weeks (pt only dilated 1.5 cm).  Second performed for h/o prior C-section.  Discussed risks vs benefits of TOLAC vs repeat C-section, noted decreased rate of success based on current history, however patient still desires a trial. Handouts given.  Will continue to discuss during later portion of pregnancy.  4. Tobacco abuse - Has cut  down to 2-3 cig/day (down from 1/2 ppd).  Continued to encourage cessation.   5. H/o anxiety - Patient d/c'd anxiety meds after onset of pregnancy.  Will monitor symptoms durin pregnancy  Follow up in 4 weeks.  Medicaid Home Pregnancy forms completed today.   50% of 30 min visit spent on counseling and coordination of care.    Hildred LaserAnika Maika Kaczmarek, MD [facility]

## 2016-04-25 NOTE — Telephone Encounter (Signed)
Edson SnowballOki this girl had surgery and had a catherter in and it was pulled out on Tuesday. She has been in extreme pain since. I told her to call the office that did surgery and pulled the cath out. They need to take care of this. Im sending this just so it is noted what I said.

## 2016-04-26 NOTE — Telephone Encounter (Signed)
Linda Thornton, I'm pretty sure this is the wrong pt. This pt is [redacted]wks pregnant we just saw her yesterday, she has not had surgery. Can you send a message on the correct pt? Thanks

## 2016-04-27 ENCOUNTER — Encounter: Payer: Self-pay | Admitting: Obstetrics and Gynecology

## 2016-04-27 DIAGNOSIS — Z720 Tobacco use: Secondary | ICD-10-CM | POA: Insufficient documentation

## 2016-04-27 DIAGNOSIS — E663 Overweight: Secondary | ICD-10-CM | POA: Insufficient documentation

## 2016-05-03 LAB — PAP IG, CT-NG, RFX HPV ASCU
Chlamydia, Nuc. Acid Amp: NEGATIVE
Gonococcus by Nucleic Acid Amp: NEGATIVE
PAP Smear Comment: 0

## 2016-05-11 ENCOUNTER — Encounter: Payer: Self-pay | Admitting: Emergency Medicine

## 2016-05-11 ENCOUNTER — Emergency Department
Admission: EM | Admit: 2016-05-11 | Discharge: 2016-05-11 | Disposition: A | Discharge status: Home / Self Care | Payer: Medicaid Other | Attending: Emergency Medicine | Admitting: Emergency Medicine

## 2016-05-11 DIAGNOSIS — O23592 Infection of other part of genital tract in pregnancy, second trimester: Secondary | ICD-10-CM | POA: Insufficient documentation

## 2016-05-11 DIAGNOSIS — B9689 Other specified bacterial agents as the cause of diseases classified elsewhere: Secondary | ICD-10-CM

## 2016-05-11 DIAGNOSIS — G43009 Migraine without aura, not intractable, without status migrainosus: Secondary | ICD-10-CM | POA: Diagnosis not present

## 2016-05-11 DIAGNOSIS — Z91040 Latex allergy status: Secondary | ICD-10-CM | POA: Insufficient documentation

## 2016-05-11 DIAGNOSIS — N760 Acute vaginitis: Secondary | ICD-10-CM

## 2016-05-11 DIAGNOSIS — Z3A16 16 weeks gestation of pregnancy: Secondary | ICD-10-CM | POA: Diagnosis not present

## 2016-05-11 DIAGNOSIS — O99352 Diseases of the nervous system complicating pregnancy, second trimester: Secondary | ICD-10-CM | POA: Diagnosis not present

## 2016-05-11 LAB — CBC WITH DIFFERENTIAL/PLATELET
Basophils Absolute: 0 10*3/uL (ref 0–0.1)
Basophils Relative: 1 %
Eosinophils Absolute: 0 10*3/uL (ref 0–0.7)
Eosinophils Relative: 1 %
HCT: 29.2 % — ABNORMAL LOW (ref 35.0–47.0)
Hemoglobin: 10.2 g/dL — ABNORMAL LOW (ref 12.0–16.0)
Lymphocytes Relative: 14 %
Lymphs Abs: 0.7 10*3/uL — ABNORMAL LOW (ref 1.0–3.6)
MCH: 31.8 pg (ref 26.0–34.0)
MCHC: 34.8 g/dL (ref 32.0–36.0)
MCV: 91.4 fL (ref 80.0–100.0)
Monocytes Absolute: 0.7 10*3/uL (ref 0.2–0.9)
Monocytes Relative: 14 %
Neutro Abs: 3.8 10*3/uL (ref 1.4–6.5)
Neutrophils Relative %: 72 %
Platelets: 154 10*3/uL (ref 150–440)
RBC: 3.2 MIL/uL — ABNORMAL LOW (ref 3.80–5.20)
RDW: 13.5 % (ref 11.5–14.5)
WBC: 5.3 10*3/uL (ref 3.6–11.0)

## 2016-05-11 LAB — WET PREP, GENITAL
Sperm: NONE SEEN
Trich, Wet Prep: NONE SEEN
Yeast Wet Prep HPF POC: NONE SEEN

## 2016-05-11 LAB — BASIC METABOLIC PANEL
Anion gap: 7 (ref 5–15)
BUN: 9 mg/dL (ref 6–20)
CO2: 21 mmol/L — ABNORMAL LOW (ref 22–32)
Calcium: 8.3 mg/dL — ABNORMAL LOW (ref 8.9–10.3)
Chloride: 108 mmol/L (ref 101–111)
Creatinine, Ser: 0.55 mg/dL (ref 0.44–1.00)
GFR calc Af Amer: >60 mL/min (ref >60)
GFR calc non Af Amer: >60 mL/min (ref >60)
Glucose, Bld: 89 mg/dL (ref 65–99)
Potassium: 3.3 mmol/L — ABNORMAL LOW (ref 3.5–5.1)
Sodium: 136 mmol/L (ref 135–145)

## 2016-05-11 MED ORDER — KETOROLAC TROMETHAMINE 30 MG/ML IJ SOLN
15.0000 mg | Freq: Once | INTRAMUSCULAR | Status: AC
  Administered 2016-05-11: 15 mg via INTRAVENOUS
  Filled 2016-05-11: qty 1

## 2016-05-11 MED ORDER — DIPHENHYDRAMINE HCL 50 MG/ML IJ SOLN
25.0000 mg | Freq: Once | INTRAMUSCULAR | Status: AC
  Administered 2016-05-11: 25 mg via INTRAVENOUS
  Filled 2016-05-11: qty 1

## 2016-05-11 MED ORDER — SODIUM CHLORIDE 0.9 % IV BOLUS (SEPSIS)
500.0000 mL | INTRAVENOUS | Status: AC
  Administered 2016-05-11: 500 mL via INTRAVENOUS

## 2016-05-11 MED ORDER — METRONIDAZOLE 500 MG PO TABS: 500.0000 mg | ORAL_TABLET | Freq: Two times a day (BID) | ORAL | 0 refills | Status: DC

## 2016-05-11 MED ORDER — MAGNESIUM SULFATE 2 GM/50ML IV SOLN
2.0000 g | Freq: Once | INTRAVENOUS | Status: AC
  Administered 2016-05-11: 2 g via INTRAVENOUS
  Filled 2016-05-11: qty 50

## 2016-05-11 MED ORDER — PROCHLORPERAZINE EDISYLATE 5 MG/ML IJ SOLN
10.0000 mg | INTRAMUSCULAR | Status: AC
  Administered 2016-05-11: 10 mg via INTRAVENOUS
  Filled 2016-05-11: qty 2

## 2016-05-11 NOTE — ED Triage Notes (Signed)
Patient to ER for pain to vaginal/clitoral area. Patient states it felt like a burning pain, began two days ago. Patient states she tried anti-fungal cream with no relief. Has discomfort to lower abdomen and discomfort when urinating, but denies burning with urination. Patient is approx [redacted] weeks pregnant.

## 2016-05-11 NOTE — ED Notes (Signed)
MD Forbach at bedside. 

## 2016-05-11 NOTE — Discharge Instructions (Signed)
You have been seen in the Emergency Department (ED) for a migraine.  Please use Tylenol or Motrin as needed for symptoms, but only as written on the box, and take any regular medications that have been prescribed for you. As we have discussed, please follow up with your doctor as soon as possible regarding today?s ED visit and your headache symptoms.    Additionally, your pelvic exam was reassuring.  You have a mild infection called bacterial vaginosis that is NOT sexually transmitted and is very common.  We provided a prescription which should treat the condition.  Please follow up with your OB provider at the next available opportunity.  Call your doctor or return to the Emergency Department (ED) if you have a worsening headache, sudden and severe headache, confusion, slurred speech, facial droop, weakness or numbness in any arm or leg, extreme fatigue, or other symptoms that concern you.

## 2016-05-11 NOTE — ED Notes (Signed)
Pt verbalized understanding of discharge instructions. NAD at this time. 

## 2016-05-11 NOTE — ED Provider Notes (Signed)
[facility] Emergency Department Provider Note  ____________________________________________   First MD Initiated Contact with Patient 05/11/16 1310     (approximate)  I have reviewed the triage vital signs and the nursing notes.   HISTORY  Chief Complaint Vaginal Pain and Abdominal Pain    HPI ASIANAE MINKLER is a 25 y.o. female who is [redacted] weeks pregnant and followed by in [facility].  She presents for evaluation of essentially 2 separate processes, she has had a severe, gradual in onset, global headache with photosensitivity and nausea for the last several days.  She states that this does not feel exactly like her chronic migraines but is similar.  She has had no numbness or tingling or weakness in any of her extremities and no visual disturbances.  She states in general she feels "bad all over".  Her female partner has had a recent viral illness that they thought might be the flu.  She reports some body aches all over.  She denies fever/chills, chest pain, shortness of breath, facial pain, sore throat, vomiting, diarrhea.  Additionally she has had pain in the upper part of her "vaginal area" for the last 2 days.  She states that it is burning severely and that it was not helped with any over-the-counter antifungal ointment.  She has some discomfort when she urinates but says that it is not specifically like having a urinary tract infection.  Nothing makes it better and the pain is severe, burning, and sharp.  She states that she feels like it is swollen in that area as well.   Past Medical History:  Diagnosis Date  . Bulging lumbar disc   . Bursitis   . Migraine headache   . Renal disorder    kidney stone    Patient Active Problem List   Diagnosis Date Noted  . Tobacco abuse 04/27/2016  . Overweight (BMI 25.0-29.9) 04/27/2016  . Previous cesarean section 09/11/2014  . Sedative, hypnotic or anxiolytic use disorder, severe, in early remission (HCC)  07/13/2014  . Adjustment disorder with anxiety 11/15/2013  . Fibromyalgia 09/09/2011    Past Surgical History:  Procedure Laterality Date  . CESAREAN SECTION      Prior to Admission medications   Medication Sig Start Date End Date Taking? Authorizing Provider  metroNIDAZOLE (FLAGYL) 500 MG tablet Take 1 tablet (500 mg total) by mouth 2 (two) times daily. 05/11/16   Loleta Rose, MD  Prenatal Multivit-Min-Fe-FA (PRENATAL VITAMINS PO) Take 1 tablet by mouth daily.    Historical Provider, MD    Allergies Hydrocodone-acetaminophen; Latex; and Penicillins  Family History  Problem Relation Age of Onset  . Irregular heart beat Mother   . Hypertension Mother   . Migraines Mother   . Rheum arthritis Maternal Grandmother   . Osteoarthritis Maternal Grandmother   . Diabetes Maternal Grandfather   . Rheum arthritis Maternal Grandfather   . Osteoarthritis Maternal Grandfather     Social History Social History  Substance Use Topics  . Smoking status: Never Smoker  . Smokeless tobacco: Never Used  . Alcohol use No    Review of Systems Constitutional: No fever/chills Eyes: No visual changes. ENT: No sore throat. Cardiovascular: Denies chest pain. Respiratory: Denies shortness of breath. Gastrointestinal: No abdominal pain.  Nausea, no vomiting.  No diarrhea.  No constipation. Genitourinary: Pain and swelling at "top of vaginal area", some discomfort when urinating Musculoskeletal: Negative for back pain. Skin: Negative for rash. Neurological: gradual onset severe headache with photophobia over  several days with nausea  10-point ROS otherwise negative.  ____________________________________________   PHYSICAL EXAM:  VITAL SIGNS: ED Triage Vitals  Enc Vitals Group     BP 05/11/16 1222 (!) 151/82     Pulse Rate 05/11/16 1222 (!) 101     Resp 05/11/16 1222 20     Temp 05/11/16 1222 98.4 F (36.9 C)     Temp Source 05/11/16 1222 Oral     SpO2 05/11/16 1222 100 %      Weight 05/11/16 1223 155 lb (70.3 kg)     Height 05/11/16 1223 5\' 1"  (1.549 m)     Head Circumference --      Peak Flow --      Pain Score 05/11/16 1222 6     Pain Loc --      Pain Edu? --      Excl. in GC? --     Constitutional: Alert and oriented. Well appearing and in no acute distress. Eyes: Conjunctivae are normal. PERRL. EOMI. Head: Atraumatic. Nose: No congestion/rhinnorhea. Mouth/Throat: Mucous membranes are moist.  Oropharynx non-erythematous. Neck: No stridor.  No meningeal signs.   Cardiovascular: Normal rate, regular rhythm. Good peripheral circulation. Grossly normal heart sounds. Respiratory: Normal respiratory effort.  No retractions. Lungs CTAB. Gastrointestinal: Soft and nontender. No distention.  Genitourinary: Nurse chaperone was present throughout the exam.  Given that the patient is in second trimester and her symptoms are confined to clitoral hood and superior aspect of the vulva, I did not perform a speculum exam.  Her external exam is normal in appearance with no erythema, no herpetic lesions, no discharge, no edema. Wet prep specimen obtained within vaginal introitus. Musculoskeletal: No lower extremity tenderness nor edema. No gross deformities of extremities. Neurologic:  Normal speech and language. No gross focal neurologic deficits are appreciated.  Skin:  Skin is warm, dry and intact. No rash noted. Psychiatric: Mood and affect are very anxious and upset.  ____________________________________________   LABS (all labs ordered are listed, but only abnormal results are displayed)  Labs Reviewed  WET PREP, GENITAL - Abnormal; Notable for the following:       Result Value   Clue Cells Wet Prep HPF POC PRESENT (*)    WBC, Wet Prep HPF POC RARE (*)    All other components within normal limits  BASIC METABOLIC PANEL - Abnormal; Notable for the following:    Potassium 3.3 (*)    CO2 21 (*)    Calcium 8.3 (*)    All other components within normal limits    CBC WITH DIFFERENTIAL/PLATELET - Abnormal; Notable for the following:    RBC 3.20 (*)    Hemoglobin 10.2 (*)    HCT 29.2 (*)    Lymphs Abs 0.7 (*)    All other components within normal limits   ____________________________________________  EKG  None - EKG not ordered by ED physician ____________________________________________  RADIOLOGY   No results found.  ____________________________________________   PROCEDURES  Procedure(s) performed:   Procedures   Critical Care performed: No ____________________________________________   INITIAL IMPRESSION / ASSESSMENT AND PLAN / ED COURSE  Pertinent labs & imaging results that were available during my care of the patient were reviewed by me and considered in my medical decision making (see chart for details).  Signs and symptoms are most consistent with a viral illness which led to an atypical migraine.  Venous sinus thrombosis must be considered but is less likely with no current sinus symptoms or facial pain.  Her headache seems to be generalized and migraine-like.  I verified that my usual migraine treatment is appropriate for her level of pregnancy and will proceed with Compazine, Benadryl, fluids, magnesium, and Toradol (she is less than 32 weeks).   Clinical Course as of May 11 1625  Sat May 11, 2016  1514 The patient feels much better after migraine treatment.  She has been sleeping comfortably but is awake and alert when I came in to talk to her.  She does not have any photophobia anymore.  Given how much her symptoms have improved to have an even lower suspicion for a sinus thrombosis or similar emergent intracranial medical condition.  Her pelvic exam was unremarkable; because of the symptoms are confined to her clitoral hood and vulva, and she is in her second trimester, I did not perform a speculum exam, but rather an external exam and obtained a wet prep.  Nurse was present throughout exam.  As documented in Exam, no  evidence of acute infection or irritation.  Awaiting wet prep results.  Patient states she feels enough better that she is ready to go, but she will wait for wet prep results.  [CF]  1618 Patient continues to feel well after treatment for her migraine.  She has clue cells so we will treat her for BV.  I gave my usual customary return precautions.  She continues to have no sign of an acute or emergent intracranial issue and feels much better after migraine treatment with a history of chronic migraines.  She will follow up with her OB provider.  [CF]    Clinical Course User Index [CF] Loleta Rose, MD    ____________________________________________  FINAL CLINICAL IMPRESSION(S) / ED DIAGNOSES  Final diagnoses:  Migraine without aura and without status migrainosus, not intractable  BV (bacterial vaginosis)     MEDICATIONS GIVEN DURING THIS VISIT:  Medications  magnesium sulfate IVPB 2 g 50 mL (0 g Intravenous Stopped 05/11/16 1442)  ketorolac (TORADOL) 30 MG/ML injection 15 mg (15 mg Intravenous Given 05/11/16 1333)  prochlorperazine (COMPAZINE) injection 10 mg (10 mg Intravenous Given 05/11/16 1334)  diphenhydrAMINE (BENADRYL) injection 25 mg (25 mg Intravenous Given 05/11/16 1335)  sodium chloride 0.9 % bolus 500 mL (0 mLs Intravenous Stopped 05/11/16 1442)     NEW OUTPATIENT MEDICATIONS STARTED DURING THIS VISIT:  New Prescriptions   METRONIDAZOLE (FLAGYL) 500 MG TABLET    Take 1 tablet (500 mg total) by mouth 2 (two) times daily.    Modified Medications   No medications on file    Discontinued Medications   No medications on file     Note:  This document was prepared using Dragon voice recognition software and may include unintentional dictation errors.    Loleta Rose, MD 05/11/16 [phone]

## 2016-05-13 ENCOUNTER — Other Ambulatory Visit: Payer: Self-pay

## 2016-05-13 ENCOUNTER — Telehealth: Payer: Self-pay | Admitting: Obstetrics and Gynecology

## 2016-05-13 DIAGNOSIS — Z3A16 16 weeks gestation of pregnancy: Secondary | ICD-10-CM

## 2016-05-13 DIAGNOSIS — N760 Acute vaginitis: Principal | ICD-10-CM

## 2016-05-13 DIAGNOSIS — B9689 Other specified bacterial agents as the cause of diseases classified elsewhere: Secondary | ICD-10-CM

## 2016-05-13 MED ORDER — METRONIDAZOLE 500 MG PO TABS: 500.0000 mg | ORAL_TABLET | Freq: Two times a day (BID) | ORAL | 0 refills | Status: DC

## 2016-05-13 MED ORDER — CONCEPT DHA 53.5-38-1 MG PO CAPS: 53.5000 mg | ORAL_CAPSULE | Freq: Every day | ORAL | 11 refills | Status: DC

## 2016-05-13 NOTE — Telephone Encounter (Signed)
Pt seen in ER and was told she had bacterial vaginosis// they gave her metronitizole for it. SHe left her baby's diaper bag in a bath room and when she went back it was gone along with her medication. She would like some more metronitizole and prenatal vitamins RX [address] [address]

## 2016-05-13 NOTE — Telephone Encounter (Signed)
RX sent in

## 2016-05-20 ENCOUNTER — Other Ambulatory Visit: Payer: Self-pay

## 2016-05-20 DIAGNOSIS — Z3A17 17 weeks gestation of pregnancy: Secondary | ICD-10-CM

## 2016-05-20 MED ORDER — CITRANATAL HARMONY 27-1-260 MG PO CAPS: 27.0000 mg | ORAL_CAPSULE | Freq: Every day | ORAL | 11 refills | Status: DC

## 2016-05-28 ENCOUNTER — Encounter: Payer: Self-pay | Admitting: Certified Nurse Midwife

## 2016-05-28 ENCOUNTER — Ambulatory Visit (INDEPENDENT_AMBULATORY_CARE_PROVIDER_SITE_OTHER): Payer: Medicaid Other | Admitting: Certified Nurse Midwife

## 2016-05-28 VITALS — BP 113/73 | HR 109 | Wt 154.5 lb

## 2016-05-28 DIAGNOSIS — Z3482 Encounter for supervision of other normal pregnancy, second trimester: Secondary | ICD-10-CM

## 2016-05-28 LAB — POCT URINALYSIS DIPSTICK
Bilirubin, UA: NEGATIVE
Blood, UA: NEGATIVE
Glucose, UA: NEGATIVE
Ketones, UA: NEGATIVE
Leukocytes, UA: NEGATIVE
Nitrite, UA: NEGATIVE
Protein, UA: NEGATIVE
Spec Grav, UA: 1.015
Urobilinogen, UA: NEGATIVE
pH, UA: 6

## 2016-05-28 MED ORDER — RANITIDINE HCL 150 MG PO TABS: 150.0000 mg | ORAL_TABLET | Freq: Two times a day (BID) | ORAL | 3 refills | Status: DC

## 2016-05-28 NOTE — Progress Notes (Addendum)
ROB-Pt reports "bad reflux" that is not relieved by Tums. Seen recently in the ER for vag itching and migraine. Completed Flagyl. Denies vaginal itching, pain, or discharge. Denies current HA or visual changes. Reviewed use of Tylenol as HA treatment. Rx Zantac sent. Red flag symptoms reviewed. RTC x 2 wks for anatomy scan. F/u with Dr Valentino Saxonherry in 4 wks.

## 2016-05-28 NOTE — Patient Instructions (Signed)
Second Trimester of Pregnancy The second trimester is from week 13 through week 28, month 4 through 6. This is often the time in pregnancy that you feel your best. Often times, morning sickness has lessened or quit. You may have more energy, and you may get hungry more often. Your unborn baby (fetus) is growing rapidly. At the end of the sixth month, he or she is about 9 inches long and weighs about 1 pounds. You will likely feel the baby move (quickening) between 18 and 20 weeks of pregnancy. Follow these instructions at home:  Avoid all smoking, herbs, and alcohol. Avoid drugs not approved by your doctor.  Do not use any tobacco products, including cigarettes, chewing tobacco, and electronic cigarettes. If you need help quitting, ask your doctor. You may get counseling or other support to help you quit.  Only take medicine as told by your doctor. Some medicines are safe and some are not during pregnancy.  Exercise only as told by your doctor. Stop exercising if you start having cramps.  Eat regular, healthy meals.  Wear a good support bra if your breasts are tender.  Do not use hot tubs, steam rooms, or saunas.  Wear your seat belt when driving.  Avoid raw meat, uncooked cheese, and liter boxes and soil used by cats.  Take your prenatal vitamins.  Take 1500-2000 milligrams of calcium daily starting at the 20th week of pregnancy until you deliver your baby.  Try taking medicine that helps you poop (stool softener) as needed, and if your doctor approves. Eat more fiber by eating fresh fruit, vegetables, and whole grains. Drink enough fluids to keep your pee (urine) clear or pale yellow.  Take warm water baths (sitz baths) to soothe pain or discomfort caused by hemorrhoids. Use hemorrhoid cream if your doctor approves.  If you have puffy, bulging veins (varicose veins), wear support hose. Raise (elevate) your feet for 15 minutes, 3-4 times a day. Limit salt in your diet.  Avoid heavy  lifting, wear low heals, and sit up straight.  Rest with your legs raised if you have leg cramps or low back pain.  Visit your dentist if you have not gone during your pregnancy. Use a soft toothbrush to brush your teeth. Be gentle when you floss.  You can have sex (intercourse) unless your doctor tells you not to.  Go to your doctor visits. Get help if:  You feel dizzy.  You have mild cramps or pressure in your lower belly (abdomen).  You have a nagging pain in your belly area.  You continue to feel sick to your stomach (nauseous), throw up (vomit), or have watery poop (diarrhea).  You have bad smelling fluid coming from your vagina.  You have pain with peeing (urination). Get help right away if:  You have a fever.  You are leaking fluid from your vagina.  You have spotting or bleeding from your vagina.  You have severe belly cramping or pain.  You lose or gain weight rapidly.  You have trouble catching your breath and have chest pain.  You notice sudden or extreme puffiness (swelling) of your face, hands, ankles, feet, or legs.  You have not felt the baby move in over an hour.  You have severe headaches that do not go away with medicine.  You have vision changes. This information is not intended to replace advice given to you by your health care provider. Make sure you discuss any questions you have with your health care   provider. Document Released: 08/21/2009 Document Revised: 11/02/2015 Document Reviewed: 07/28/2012 Elsevier Interactive Patient Education  2017 [employer]  

## 2016-06-06 ENCOUNTER — Other Ambulatory Visit: Payer: Self-pay

## 2016-06-06 DIAGNOSIS — Z3A20 20 weeks gestation of pregnancy: Secondary | ICD-10-CM

## 2016-06-06 MED ORDER — CITRANATAL DHA 27-1 & 250 MG PO MISC: 1.0000 | Freq: Every day | ORAL | 11 refills | Status: DC

## 2016-06-12 ENCOUNTER — Other Ambulatory Visit: Payer: Self-pay

## 2016-06-27 ENCOUNTER — Encounter: Payer: Self-pay | Admitting: Certified Nurse Midwife

## 2016-07-03 ENCOUNTER — Ambulatory Visit (INDEPENDENT_AMBULATORY_CARE_PROVIDER_SITE_OTHER): Payer: Medicaid Other

## 2016-07-03 ENCOUNTER — Encounter: Payer: Self-pay | Admitting: Obstetrics and Gynecology

## 2016-07-03 ENCOUNTER — Ambulatory Visit (INDEPENDENT_AMBULATORY_CARE_PROVIDER_SITE_OTHER): Payer: Medicaid Other | Admitting: Obstetrics and Gynecology

## 2016-07-03 VITALS — BP 119/81 | HR 89 | Wt 159.1 lb

## 2016-07-03 DIAGNOSIS — Z3482 Encounter for supervision of other normal pregnancy, second trimester: Secondary | ICD-10-CM | POA: Diagnosis not present

## 2016-07-03 DIAGNOSIS — R350 Frequency of micturition: Secondary | ICD-10-CM

## 2016-07-03 DIAGNOSIS — Z3492 Encounter for supervision of normal pregnancy, unspecified, second trimester: Secondary | ICD-10-CM

## 2016-07-03 LAB — POCT URINALYSIS DIPSTICK
Bilirubin, UA: NEGATIVE
Blood, UA: NEGATIVE
Glucose, UA: NEGATIVE
Ketones, UA: NEGATIVE
Leukocytes, UA: NEGATIVE
Nitrite, UA: NEGATIVE
Protein, UA: NEGATIVE
Spec Grav, UA: 1.02
Urobilinogen, UA: NEGATIVE
pH, UA: 5

## 2016-07-03 NOTE — Progress Notes (Signed)
She feels some pelvic pressure like she is not emptying her bladder. - UA negative 2 prior C-sections - discussed likely necessity of repeat.

## 2016-07-03 NOTE — Progress Notes (Signed)
26 yo here for a routine prenatal visit. P2G3. Pt had an anatomical U/S today. Also c/o low abdominal "cramping", frequency and it feels like she is not emptying.

## 2016-07-06 LAB — URINE CULTURE

## 2016-07-31 ENCOUNTER — Ambulatory Visit (INDEPENDENT_AMBULATORY_CARE_PROVIDER_SITE_OTHER): Payer: Medicaid Other | Admitting: Obstetrics and Gynecology

## 2016-07-31 ENCOUNTER — Other Ambulatory Visit: Payer: Medicaid Other

## 2016-07-31 VITALS — BP 115/63 | HR 90 | Wt 162.7 lb

## 2016-07-31 DIAGNOSIS — Z3483 Encounter for supervision of other normal pregnancy, third trimester: Secondary | ICD-10-CM | POA: Diagnosis not present

## 2016-07-31 DIAGNOSIS — Z131 Encounter for screening for diabetes mellitus: Secondary | ICD-10-CM

## 2016-07-31 DIAGNOSIS — O34219 Maternal care for unspecified type scar from previous cesarean delivery: Secondary | ICD-10-CM

## 2016-07-31 DIAGNOSIS — Z23 Encounter for immunization: Secondary | ICD-10-CM | POA: Diagnosis not present

## 2016-07-31 LAB — POCT URINALYSIS DIPSTICK
Bilirubin, UA: NEGATIVE
Blood, UA: NEGATIVE
Glucose, UA: NEGATIVE
Ketones, UA: NEGATIVE
Leukocytes, UA: NEGATIVE
Nitrite, UA: NEGATIVE
Protein, UA: NEGATIVE
Spec Grav, UA: <=1.005
Urobilinogen, UA: NEGATIVE
pH, UA: 7.5

## 2016-07-31 MED ORDER — TETANUS-DIPHTH-ACELL PERTUSSIS 5-2.5-18.5 LF-MCG/0.5 IM SUSP
0.5000 mL | Freq: Once | INTRAMUSCULAR | Status: AC
  Administered 2016-07-31: 0.5 mL via INTRAMUSCULAR

## 2016-07-31 NOTE — Progress Notes (Signed)
ROB: Doing well, no complaints.  For 28 week labs today.  Desires to breastfeed, desires unsure method for contraception.  Given handout on options, to discuss again next visit.  For Tdap today, signed blood consent, discussed cord blood banking. Still desires a TOLAC despite h/o prior C/S x 2. [facility]. RTC in 2 weeks.

## 2016-08-01 LAB — HEMOGLOBIN AND HEMATOCRIT, BLOOD
Hematocrit: 32 % — ABNORMAL LOW (ref 34.0–46.6)
Hemoglobin: 10.8 g/dL — ABNORMAL LOW (ref 11.1–15.9)

## 2016-08-01 LAB — GLUCOSE, 1 HOUR GESTATIONAL: Gestational Diabetes Screen: 114 mg/dL (ref 65–139)

## 2016-08-14 ENCOUNTER — Encounter: Payer: Self-pay | Admitting: Obstetrics and Gynecology

## 2016-08-14 ENCOUNTER — Ambulatory Visit (INDEPENDENT_AMBULATORY_CARE_PROVIDER_SITE_OTHER): Payer: Medicaid Other | Admitting: Obstetrics and Gynecology

## 2016-08-14 VITALS — BP 121/66 | HR 101 | Wt 163.1 lb

## 2016-08-14 DIAGNOSIS — O34219 Maternal care for unspecified type scar from previous cesarean delivery: Secondary | ICD-10-CM | POA: Diagnosis not present

## 2016-08-14 DIAGNOSIS — Z3483 Encounter for supervision of other normal pregnancy, third trimester: Secondary | ICD-10-CM | POA: Diagnosis not present

## 2016-08-14 NOTE — Progress Notes (Signed)
She feels tired but has no other complaints today. She has reaffirmed her desire for TOLAC. Ultrasound scheduled for follow-up of face.

## 2016-08-19 ENCOUNTER — Other Ambulatory Visit: Payer: Self-pay

## 2016-08-23 ENCOUNTER — Ambulatory Visit (INDEPENDENT_AMBULATORY_CARE_PROVIDER_SITE_OTHER): Payer: Medicaid Other

## 2016-08-23 DIAGNOSIS — Z3483 Encounter for supervision of other normal pregnancy, third trimester: Secondary | ICD-10-CM

## 2016-08-28 ENCOUNTER — Encounter: Payer: Self-pay | Admitting: Obstetrics and Gynecology

## 2016-09-13 ENCOUNTER — Ambulatory Visit (INDEPENDENT_AMBULATORY_CARE_PROVIDER_SITE_OTHER): Payer: Medicaid Other | Admitting: Obstetrics and Gynecology

## 2016-09-13 VITALS — BP 117/73 | HR 111 | Wt 167.4 lb

## 2016-09-13 DIAGNOSIS — R12 Heartburn: Secondary | ICD-10-CM

## 2016-09-13 DIAGNOSIS — O479 False labor, unspecified: Secondary | ICD-10-CM

## 2016-09-13 DIAGNOSIS — Z3483 Encounter for supervision of other normal pregnancy, third trimester: Secondary | ICD-10-CM

## 2016-09-13 DIAGNOSIS — O99891 Other specified diseases and conditions complicating pregnancy: Secondary | ICD-10-CM

## 2016-09-13 DIAGNOSIS — O9989 Other specified diseases and conditions complicating pregnancy, childbirth and the puerperium: Secondary | ICD-10-CM

## 2016-09-13 DIAGNOSIS — O26893 Other specified pregnancy related conditions, third trimester: Secondary | ICD-10-CM

## 2016-09-13 DIAGNOSIS — M549 Dorsalgia, unspecified: Secondary | ICD-10-CM

## 2016-09-13 LAB — POCT URINALYSIS DIPSTICK
Bilirubin, UA: NEGATIVE
Blood, UA: NEGATIVE
Glucose, UA: NEGATIVE
Ketones, UA: NEGATIVE
Nitrite, UA: NEGATIVE
Protein, UA: NEGATIVE
Spec Grav, UA: 1.01 (ref 1.030–1.035)
Urobilinogen, UA: NEGATIVE (ref ?–2.0)
pH, UA: 7.5 (ref 5.0–8.0)

## 2016-09-13 MED ORDER — PANTOPRAZOLE SODIUM 20 MG PO TBEC: 20.0000 mg | DELAYED_RELEASE_TABLET | Freq: Every day | ORAL | 2 refills | Status: DC

## 2016-09-13 NOTE — Progress Notes (Signed)
ROB: Patient complains of back pain, Braxton Hicks, pelvic pressure. Discussed Tylenol ES prn, warm baths, pregnancy massage, acupuncture, and belly band. Also notes indigestion not relived by Tums or Zantac. Prescribed Protonix.  Discussed circumcision for female infant, desired.  RTC in 2 weeks. For 36 week labs at that time.

## 2016-09-13 NOTE — Patient Instructions (Addendum)
Heartburn During Pregnancy Heartburn is pain or discomfort in the throat or chest. It may cause a burning feeling. It happens when stomach acid moves up into the tube that carries food from your mouth to your stomach (esophagus). Heartburn is common during pregnancy. It usually goes away or gets better after giving birth. Follow these instructions at home: Eating and drinking   Do not drink alcohol while you are pregnant.  Figure out which foods and beverages make you feel worse, and avoid them.  Beverages that you may want to avoid include:  Coffee and tea (with or without caffeine).  Energy drinks and sports drinks.  Bubbly (carbonated) drinks or sodas.  Citrus fruit juices.  Foods that you may want to avoid include:  Chocolate and cocoa.  Peppermint and mint flavorings.  Garlic, onions, and horseradish.  Spicy and acidic foods. These include peppers, chili powder, curry powder, vinegar, hot sauces, and barbecue sauce.  Citrus fruits, such as oranges, lemons, and limes.  Tomato-based foods, such as red sauce, chili, and salsa.  Fried and fatty foods, such as donuts, french fries, potato chips, and high-fat dressings.  High-fat meats, such as hot dogs, cold cuts, sausage, ham, and bacon.  High-fat dairy items, such as whole milk, butter, and cheese.  Eat small meals often, instead of large meals.  Avoid drinking a lot of liquid with your meals.  Avoid eating meals during the 2-3 hours before you go to bed.  Avoid lying down right after you eat.  Do not exercise right after you eat. Medicines   Take over-the-counter and prescription medicines only as told by your doctor.  Do not take aspirin, ibuprofen, or other NSAIDs unless your doctor tells you to do that.  Your doctor may tell you to avoid medicines that have sodium bicarbonate in them. General instructions   If told, raise the head of your bed about 6 inches (15 cm). You can do this by putting blocks  under the legs. Sleeping with more pillows does not help with heartburn.  Do not use any products that contain nicotine or tobacco, such as cigarettes and e-cigarettes. If you need help quitting, ask your doctor.  Wear loose-fitting clothing.  Try to lower your stress, such as with yoga or meditation. If you need help, ask your doctor.  Stay at a healthy weight. If you are overweight, work with your doctor to safely lose weight.  Keep all follow-up visits as told by your doctor. This is important. Contact a doctor if:  You get new symptoms.  Your symptoms do not get better with treatment.  You have weight loss and you do not know why.  You have trouble swallowing.  You make loud sounds when you breathe (wheeze).  You have a cough that does not go away.  You have heartburn often for more than 2 weeks.  You feel sick to your stomach (nauseous), and this does not get better with treatment.  You are throwing up (vomiting), and this does not get better with treatment.  You have pain in your belly (abdomen). Get help right away if:  You have very bad chest pain that spreads to your arm, neck, or jaw.  You feel sweaty, dizzy, or light-headed.  You have trouble breathing.  You have pain when swallowing.  You throw up and your throw-up looks like blood or coffee grounds.  Your poop (stool) is bloody or black. This information is not intended to replace advice given to you by your  health care provider. Make sure you discuss any questions you have with your health care provider. Document Released: 06/29/2010 Document Revised: 02/12/2016 Document Reviewed: 02/12/2016 Elsevier Interactive Patient Education  2017 [employer]      [employer] of the uterus can occur throughout pregnancy, but they are not always a sign that you are in labor. You may have practice contractions called Braxton Hicks contractions. These false labor contractions are  sometimes confused with true labor. What are Deberah Pelton contractions? Braxton Hicks contractions are tightening movements that occur in the muscles of the uterus before labor. Unlike true labor contractions, these contractions do not result in opening (dilation) and thinning of the cervix. Toward the end of pregnancy (32-34 weeks), Braxton Hicks contractions can happen more often and may become stronger. These contractions are sometimes difficult to tell apart from true labor because they can be very uncomfortable. You should not feel embarrassed if you go to the hospital with false labor. Sometimes, the only way to tell if you are in true labor is for your health care provider to look for changes in the cervix. The health care provider will do a physical exam and may monitor your contractions. If you are not in true labor, the exam should show that your cervix is not dilating and your water has not broken. If there are no prenatal problems or other health problems associated with your pregnancy, it is completely safe for you to be sent home with false labor. You may continue to have Braxton Hicks contractions until you go into true labor. How can I tell the difference between true labor and false labor?  Differences  False labor  Contractions last 30-70 seconds.: Contractions are usually shorter and not as strong as true labor contractions.  Contractions become very regular.: Contractions are usually irregular.  Discomfort is usually felt in the top of the uterus, and it spreads to the lower abdomen and low back.: Contractions are often felt in the front of the lower abdomen and in the groin.  Contractions do not go away with walking.: Contractions may go away when you walk around or change positions while lying down.  Contractions usually become more intense and increase in frequency.: Contractions get weaker and are shorter-lasting as time goes on.  The cervix dilates and gets thinner.:  The cervix usually does not dilate or become thin. Follow these instructions at home:  Take over-the-counter and prescription medicines only as told by your health care provider.  Keep up with your usual exercises and follow other instructions from your health care provider.  Eat and drink lightly if you think you are going into labor.  If Braxton Hicks contractions are making you uncomfortable:  Change your position from lying down or resting to walking, or change from walking to resting.  Sit and rest in a tub of warm water.  Drink enough fluid to keep your urine clear or pale yellow. Dehydration may cause these contractions.  Do slow and deep breathing several times an hour.  Keep all follow-up prenatal visits as told by your health care provider. This is important. Contact a health care provider if:  You have a fever.  You have continuous pain in your abdomen. Get help right away if:  Your contractions become stronger, more regular, and closer together.  You have fluid leaking or gushing from your vagina.  You pass blood-tinged mucus (bloody show).  You have bleeding from your vagina.  You have low back pain  that you never had before.  You feel your baby's head pushing down and causing pelvic pressure.  Your baby is not moving inside you as much as it used to. Summary  Contractions that occur before labor are called Braxton Hicks contractions, false labor, or practice contractions.  Braxton Hicks contractions are usually shorter, weaker, farther apart, and less regular than true labor contractions. True labor contractions usually become progressively stronger and regular and they become more frequent.  Manage discomfort from [facility] contractions by changing position, resting in a warm bath, drinking plenty of water, or practicing deep breathing. This information is not intended to replace advice given to you by your health care provider. Make sure you discuss  any questions you have with your health care provider. Document Released: 05/27/2005 Document Revised: 04/15/2016 Document Reviewed: 04/15/2016 Elsevier Interactive Patient Education  2017 [employer].

## 2016-09-26 ENCOUNTER — Ambulatory Visit (INDEPENDENT_AMBULATORY_CARE_PROVIDER_SITE_OTHER): Payer: Medicaid Other | Admitting: Obstetrics and Gynecology

## 2016-09-26 ENCOUNTER — Encounter: Payer: Self-pay | Admitting: Obstetrics and Gynecology

## 2016-09-26 VITALS — BP 108/67 | HR 81 | Wt 166.0 lb

## 2016-09-26 DIAGNOSIS — O34219 Maternal care for unspecified type scar from previous cesarean delivery: Secondary | ICD-10-CM

## 2016-09-26 DIAGNOSIS — Z3493 Encounter for supervision of normal pregnancy, unspecified, third trimester: Secondary | ICD-10-CM

## 2016-09-26 LAB — POCT URINALYSIS DIPSTICK
Bilirubin, UA: NEGATIVE
Blood, UA: NEGATIVE
Glucose, UA: NEGATIVE
Ketones, UA: NEGATIVE
Leukocytes, UA: NEGATIVE
Nitrite, UA: NEGATIVE
Protein, UA: NEGATIVE
Spec Grav, UA: 1.015 (ref 1.010–1.025)
Urobilinogen, UA: NEGATIVE E.U./dL — AB
pH, UA: 6.5 (ref 5.0–8.0)

## 2016-09-26 NOTE — Progress Notes (Signed)
ROB:  Desires VBAC (2 prior CDs).  Not sure "if I'm going to deliver here or at [facility]."  Both more than 1 hr drive.  S&S discussed.  Pt encouraged to deliver here.   GC/CT GBS done.

## 2016-09-29 LAB — GC/CHLAMYDIA PROBE AMP
Chlamydia trachomatis, NAA: NEGATIVE
Neisseria gonorrhoeae by PCR: NEGATIVE

## 2016-10-01 ENCOUNTER — Telehealth: Payer: Self-pay | Admitting: Obstetrics and Gynecology

## 2016-10-01 LAB — STREP GP B CULTURE+RFLX: Strep Gp B Culture+Rflx: NEGATIVE

## 2016-10-01 NOTE — Telephone Encounter (Signed)
Patient left voicemail with questions for the nurse.

## 2016-10-02 NOTE — Telephone Encounter (Signed)
Linda Thornton this pt sees dr. Logan Bores was sent to me by mistake

## 2016-10-02 NOTE — Telephone Encounter (Signed)
Message left for pt to return my call.

## 2016-10-03 ENCOUNTER — Encounter: Payer: Self-pay | Admitting: Obstetrics and Gynecology

## 2017-02-18 ENCOUNTER — Encounter (HOSPITAL_COMMUNITY): Payer: Self-pay

## 2017-04-10 ENCOUNTER — Emergency Department
Admission: EM | Admit: 2017-04-10 | Discharge: 2017-04-10 | Disposition: A | Discharge status: Home / Self Care | Payer: Medicaid Other | Attending: Emergency Medicine | Admitting: Emergency Medicine

## 2017-04-10 ENCOUNTER — Encounter: Payer: Self-pay | Admitting: Emergency Medicine

## 2017-04-10 DIAGNOSIS — Z79899 Other long term (current) drug therapy: Secondary | ICD-10-CM | POA: Diagnosis not present

## 2017-04-10 DIAGNOSIS — Z91040 Latex allergy status: Secondary | ICD-10-CM | POA: Insufficient documentation

## 2017-04-10 DIAGNOSIS — R300 Dysuria: Secondary | ICD-10-CM | POA: Diagnosis not present

## 2017-04-10 DIAGNOSIS — Z87891 Personal history of nicotine dependence: Secondary | ICD-10-CM | POA: Insufficient documentation

## 2017-04-10 DIAGNOSIS — N760 Acute vaginitis: Secondary | ICD-10-CM | POA: Insufficient documentation

## 2017-04-10 DIAGNOSIS — B9689 Other specified bacterial agents as the cause of diseases classified elsewhere: Secondary | ICD-10-CM | POA: Diagnosis not present

## 2017-04-10 DIAGNOSIS — N898 Other specified noninflammatory disorders of vagina: Secondary | ICD-10-CM

## 2017-04-10 MED ORDER — METRONIDAZOLE 500 MG PO TABS: 500.0000 mg | ORAL_TABLET | Freq: Two times a day (BID) | ORAL | 0 refills | Status: AC
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
## 2017-04-10 NOTE — Discharge Instructions (Signed)
Take medication as prescribed. Return to emergency department if symptoms worsen and follow-up with PCP as needed.   °

## 2017-04-10 NOTE — ED Triage Notes (Signed)
Pt reports x1 vaginal odor, discharge and irritation.  Pt has had similar sxs in the past.

## 2017-05-09 ENCOUNTER — Other Ambulatory Visit: Payer: Self-pay

## 2017-05-09 ENCOUNTER — Emergency Department
Admission: EM | Admit: 2017-05-09 | Discharge: 2017-05-09 | Disposition: A | Discharge status: Home / Self Care | Payer: Medicaid Other | Attending: Emergency Medicine | Admitting: Emergency Medicine

## 2017-05-09 DIAGNOSIS — F17210 Nicotine dependence, cigarettes, uncomplicated: Secondary | ICD-10-CM | POA: Insufficient documentation

## 2017-05-09 DIAGNOSIS — S71132A Puncture wound without foreign body, left thigh, initial encounter: Secondary | ICD-10-CM | POA: Insufficient documentation

## 2017-05-09 DIAGNOSIS — Z79899 Other long term (current) drug therapy: Secondary | ICD-10-CM | POA: Diagnosis not present

## 2017-05-09 DIAGNOSIS — Y9289 Other specified places as the place of occurrence of the external cause: Secondary | ICD-10-CM | POA: Insufficient documentation

## 2017-05-09 DIAGNOSIS — Z91040 Latex allergy status: Secondary | ICD-10-CM | POA: Diagnosis not present

## 2017-05-09 DIAGNOSIS — Z23 Encounter for immunization: Secondary | ICD-10-CM | POA: Diagnosis not present

## 2017-05-09 DIAGNOSIS — Y999 Unspecified external cause status: Secondary | ICD-10-CM | POA: Insufficient documentation

## 2017-05-09 DIAGNOSIS — Y939 Activity, unspecified: Secondary | ICD-10-CM | POA: Diagnosis not present

## 2017-05-09 DIAGNOSIS — W540XXA Bitten by dog, initial encounter: Secondary | ICD-10-CM | POA: Diagnosis not present

## 2017-05-09 MED ORDER — TETANUS-DIPHTH-ACELL PERTUSSIS 5-2.5-18.5 LF-MCG/0.5 IM SUSP
0.5000 mL | Freq: Once | INTRAMUSCULAR | Status: AC
  Administered 2017-05-09: 0.5 mL via INTRAMUSCULAR
  Filled 2017-05-09: qty 0.5

## 2017-05-09 MED ORDER — TRAMADOL HCL 50 MG PO TABS: 50.0000 mg | ORAL_TABLET | Freq: Four times a day (QID) | ORAL | 0 refills | Status: DC | PRN

## 2017-05-09 MED ORDER — AMOXICILLIN-POT CLAVULANATE 875-125 MG PO TABS: 1.0000 | ORAL_TABLET | Freq: Two times a day (BID) | ORAL | 0 refills | Status: DC

## 2017-05-09 MED ORDER — FLUCONAZOLE 150 MG PO TABS: ORAL_TABLET | ORAL | 0 refills | Status: DC

## 2017-05-09 NOTE — Discharge Instructions (Signed)
Follow up with your regular doctor or the acute care if you are not better in 3 days, return to the emergency department if your wound is becoming infected, return to the emergency department if animal control determines that the dog has rabies, the waiting. Is 7-10 days while they watch the dog, if the dog has rabies you will absolutely have to get the rabies vaccine

## 2017-05-09 NOTE — ED Notes (Signed)
AAOx3.  Skin warm and dry.  NAD 
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
## 2017-05-09 NOTE — ED Triage Notes (Signed)
Pt states she was at a friends house last night and there 20month old pit breed dog bit her on the left thigh, pt has noted bruising and puncture wounds. Owner told her the dog does not have vaccinations. Owner name is Norm ParcelCedar Gonzalez [phone] states unsure of address but is in [place].
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
## 2018-01-01 ENCOUNTER — Encounter: Payer: Self-pay | Admitting: Obstetrics and Gynecology

## 2018-01-01 ENCOUNTER — Ambulatory Visit: Payer: Medicaid Other | Admitting: Obstetrics and Gynecology

## 2018-01-01 ENCOUNTER — Other Ambulatory Visit (HOSPITAL_COMMUNITY)
Admission: RE | Admit: 2018-01-01 | Discharge: 2018-01-01 | Disposition: A | Discharge status: Home / Self Care | Payer: Medicaid Other | Source: Ambulatory Visit | Attending: Obstetrics and Gynecology | Admitting: Obstetrics and Gynecology

## 2018-01-01 VITALS — BP 120/78 | HR 77 | Ht 62.0 in | Wt 125.0 lb

## 2018-01-01 DIAGNOSIS — Z113 Encounter for screening for infections with a predominantly sexual mode of transmission: Secondary | ICD-10-CM | POA: Diagnosis not present

## 2018-01-01 DIAGNOSIS — N921 Excessive and frequent menstruation with irregular cycle: Secondary | ICD-10-CM

## 2018-01-01 DIAGNOSIS — Z975 Presence of (intrauterine) contraceptive device: Secondary | ICD-10-CM

## 2018-01-01 LAB — POCT URINE PREGNANCY: Preg Test, Ur: NEGATIVE

## 2018-01-01 NOTE — Patient Instructions (Signed)
I value your feedback and entrusting us with your care. If you get a [facility] patient survey, I would appreciate you taking the time to let us know about your experience today. Thank you! 

## 2018-01-01 NOTE — Progress Notes (Signed)
Patient, No Pcp Per   Chief Complaint  Patient presents with  . Contraception    Abnormal bleeding, Bleeding with nuvaring in     HPI:      [name] is a 27 y.o. [id number] who LMP was No LMP recorded (lmp unknown)., presents today for BTB with nuvaring (prescribed elsewhere). Pt is on her 2nd month of ring and started having bleeding 3rd wk. She had been on nexplanon prior to changing to nuvaring. Bleeding was heavy for a day but has become lighter. Pt has dysmen with bleeding. She denies any vag sx. She is sex active, no new partners. Has not had UPT.    Past Medical History:  Diagnosis Date  . Bulging lumbar disc   . Bursitis   . Migraine headache   . Renal disorder    kidney stone    Past Surgical History:  Procedure Laterality Date  . CESAREAN SECTION      Family History  Problem Relation Age of Onset  . Irregular heart beat Mother   . Hypertension Mother   . Migraines Mother   . Rheum arthritis Maternal Grandmother   . Osteoarthritis Maternal Grandmother   . Diabetes Maternal Grandfather   . Rheum arthritis Maternal Grandfather   . Osteoarthritis Maternal Grandfather   . Heart disease Maternal Grandfather     Social History   Socioeconomic History  . Marital status: Single    Spouse name: Not on file  . Number of children: Not on file  . Years of education: Not on file  . Highest education level: Not on file  Occupational History  . Not on file  Social Needs  . Financial resource strain: Not on file  . Food insecurity:    Worry: Not on file    Inability: Not on file  . Transportation needs:    Medical: Not on file    Non-medical: Not on file  Tobacco Use  . Smoking status: Current Every Day Smoker    Packs/day: 0.25    Types: Cigarettes  . Smokeless tobacco: Never Used  Substance and Sexual Activity  . Alcohol use: No  . Drug use: No  . Sexual activity: Yes    Partners: Male    Birth control/protection: Inserts  Lifestyle  .  Physical activity:    Days per week: Not on file    Minutes per session: Not on file  . Stress: Not on file  Relationships  . Social connections:    Talks on phone: Not on file    Gets together: Not on file    Attends religious service: Not on file    Active member of club or organization: Not on file    Attends meetings of clubs or organizations: Not on file    Relationship status: Not on file  . Intimate partner violence:    Fear of current or ex partner: Not on file    Emotionally abused: Not on file    Physically abused: Not on file    Forced sexual activity: Not on file  Other Topics Concern  . Not on file  Social History Narrative  . Not on file    Outpatient Medications Prior to Visit  Medication Sig Dispense Refill  . amphetamine-dextroamphetamine (ADDERALL) 10 MG tablet TK 1 T PO D  0  . buprenorphine (SUBUTEX) 8 MG SUBL SL tablet DISSOLVE TID ON TONGUE  0  . NUVARING 0.12-0.015 MG/24HR vaginal ring INSERT 1 RING VAGINALLY FOR  3 WEEKS THEN REMOVE FOR 1 WEEK  10  . amoxicillin-clavulanate (AUGMENTIN) 875-125 MG tablet Take 1 tablet by mouth 2 (two) times daily. 14 tablet 0  . fluconazole (DIFLUCAN) 150 MG tablet Take one now and one in a week 2 tablet 0  . pantoprazole (PROTONIX) 20 MG tablet Take 1 tablet (20 mg total) by mouth daily. 60 tablet 2  . Prenat w/o A-FeCbGl-DSS-FA-DHA (CITRANATAL DHA) 27-1 & 250 MG tablet Take 1 tablet by mouth daily. 30 tablet 11  . traMADol (ULTRAM) 50 MG tablet Take 1 tablet (50 mg total) by mouth every 6 (six) hours as needed. 20 tablet 0   No facility-administered medications prior to visit.      ROS:  Review of Systems  Constitutional: Negative for fever.  Gastrointestinal: Negative for blood in stool, constipation, diarrhea, nausea and vomiting.  Genitourinary: Negative for dyspareunia, dysuria, flank pain, frequency, hematuria, urgency, vaginal bleeding, vaginal discharge and vaginal pain.  Musculoskeletal: Negative for back  pain.  Skin: Negative for rash.    OBJECTIVE:   Vitals:  BP 120/78   Pulse 77   Ht 5\' 2"  (1.575 m)   Wt 125 lb (56.7 kg)   LMP  (LMP Unknown)   Breastfeeding? No   BMI 22.86 kg/m   Physical Exam  Constitutional: She is oriented to person, place, and time. Vital signs are normal. She appears well-developed.  Pulmonary/Chest: Effort normal.  Genitourinary: Uterus normal. There is no rash, tenderness or lesion on the right labia. There is no rash, tenderness or lesion on the left labia. Uterus is not enlarged and not tender. Cervix exhibits no motion tenderness. Right adnexum displays no mass and no tenderness. Left adnexum displays no mass and no tenderness. There is bleeding in the vagina. No erythema or tenderness in the vagina. No vaginal discharge found.  Musculoskeletal: Normal range of motion.  Neurological: She is alert and oriented to person, place, and time.  Psychiatric: She has a normal mood and affect. Her behavior is normal. Thought content normal.  Vitals reviewed.   Results: Results for orders placed or performed in visit on 01/01/18 (from the past 24 hour(s))  POCT urine pregnancy     Status: Normal   Collection Time: 01/01/18 10:23 AM  Result Value Ref Range   Preg Test, Ur Negative Negative     Assessment/Plan: Breakthrough bleeding with NuvaRing - Neg UPT. 2nd month of ring use. Check gon/chlam. If neg, reassurance.  F/u if sx persist by 4th month.  - Plan: POCT urine pregnancy  Screening for STD (sexually transmitted disease) - Plan: Cervicovaginal ancillary only    Return if symptoms worsen or fail to improve.  Alicia B. Copland, PA-C 01/01/2018 10:24 AM

## 2018-01-02 LAB — CERVICOVAGINAL ANCILLARY ONLY
Chlamydia: NEGATIVE
Neisseria Gonorrhea: NEGATIVE
Trichomonas: NEGATIVE

## 2018-01-09 ENCOUNTER — Ambulatory Visit: Payer: Self-pay | Admitting: Adult Health

## 2018-01-20 NOTE — Telephone Encounter (Signed)
What do I do with this message ?

## 2018-02-16 ENCOUNTER — Encounter: Payer: Self-pay | Admitting: Obstetrics and Gynecology

## 2018-02-16 ENCOUNTER — Ambulatory Visit: Payer: Medicaid Other | Admitting: Obstetrics and Gynecology

## 2018-02-16 VITALS — BP 144/90 | Wt 119.0 lb

## 2018-02-16 DIAGNOSIS — Z3044 Encounter for surveillance of vaginal ring hormonal contraceptive device: Secondary | ICD-10-CM

## 2018-02-16 NOTE — Progress Notes (Signed)
Patient, No Pcp Per   Chief Complaint  Patient presents with  . Follow-up    Medication follow up    HPI:      [name] is a 27 y.o. [id number] who LMP was No LMP recorded., presents today for [facility] f/u on nuvaring. She was seen 7/19 due to BTB with it, but had only been on it for 2 months. Had neg STD testing/exam. Pt states menses are now monthly, lasting 3 days, no BTB, mild dysmen. Doing well. Would like to cont [facility] (Rx given at 7/19 appt, so pt should have RF). Did nexplanon in past but hated it.  Had PP visit 2018, past due for annual.    Past Medical History:  Diagnosis Date  . Bulging lumbar disc   . Bursitis   . Migraine headache   . Renal disorder    kidney stone    Past Surgical History:  Procedure Laterality Date  . CESAREAN SECTION      Family History  Problem Relation Age of Onset  . Irregular heart beat Mother   . Hypertension Mother   . Migraines Mother   . Rheum arthritis Maternal Grandmother   . Osteoarthritis Maternal Grandmother   . Diabetes Maternal Grandfather   . Rheum arthritis Maternal Grandfather   . Osteoarthritis Maternal Grandfather   . Heart disease Maternal Grandfather   . Other Brother 30       drug overdose    Social History   Socioeconomic History  . Marital status: Single    Spouse name: Not on file  . Number of children: Not on file  . Years of education: Not on file  . Highest education level: Not on file  Occupational History  . Not on file  Social Needs  . Financial resource strain: Not on file  . Food insecurity:    Worry: Not on file    Inability: Not on file  . Transportation needs:    Medical: Not on file    Non-medical: Not on file  Tobacco Use  . Smoking status: Current Every Day Smoker    Packs/day: 0.25    Types: Cigarettes  . Smokeless tobacco: Never Used  Substance and Sexual Activity  . Alcohol use: No  . Drug use: No  . Sexual activity: Yes    Partners: Male    Birth control/protection:  Inserts  Lifestyle  . Physical activity:    Days per week: Not on file    Minutes per session: Not on file  . Stress: Not on file  Relationships  . Social connections:    Talks on phone: Not on file    Gets together: Not on file    Attends religious service: Not on file    Active member of club or organization: Not on file    Attends meetings of clubs or organizations: Not on file    Relationship status: Not on file  . Intimate partner violence:    Fear of current or ex partner: Not on file    Emotionally abused: Not on file    Physically abused: Not on file    Forced sexual activity: Not on file  Other Topics Concern  . Not on file  Social History Narrative  . Not on file    Outpatient Medications Prior to Visit  Medication Sig Dispense Refill  . amphetamine-dextroamphetamine (ADDERALL) 10 MG tablet TK 1 T PO D  0  . buprenorphine (SUBUTEX) 8 MG SUBL SL  tablet DISSOLVE TID ON TONGUE  0  . NUVARING 0.12-0.015 MG/24HR vaginal ring INSERT 1 RING VAGINALLY FOR 3 WEEKS THEN REMOVE FOR 1 WEEK  10   No facility-administered medications prior to visit.       ROS:  Review of Systems  Constitutional: Negative for fever.  Gastrointestinal: Negative for blood in stool, constipation, diarrhea, nausea and vomiting.  Genitourinary: Negative for dyspareunia, dysuria, flank pain, frequency, hematuria, urgency, vaginal bleeding, vaginal discharge and vaginal pain.  Musculoskeletal: Negative for back pain.  Skin: Negative for rash.    OBJECTIVE:   Vitals:  BP (!) 144/90   Wt 119 lb (54 kg)   BMI 21.77 kg/m   Physical Exam  Constitutional: She is oriented to person, place, and time. She appears well-developed. She appears distressed.  Neck: Normal range of motion.  Pulmonary/Chest: Effort normal.  Musculoskeletal: Normal range of motion.  Neurological: She is alert and oriented to person, place, and time. No cranial nerve deficit.  Psychiatric: She has a normal mood and  affect. Her behavior is normal. Judgment and thought content normal.  Vitals reviewed.  PT VERY UPSET DUE TO SUDDEN LOSS OF BROTHER 4 DAYS AGO  Assessment/Plan: Encounter for surveillance of vaginal ring hormonal contraceptive device - Doing well. RF eRxd 7/19. RTO in 2 months for annual.     Return in about 2 months (around 04/18/2018) for annual.  Alaysha Jefcoat B. Edlyn Rosenburg, PA-C 02/16/2018 11:19 AM

## 2018-02-16 NOTE — Patient Instructions (Signed)
I value your feedback and entrusting us with your care. If you get a [place] patient survey, I would appreciate you taking the time to let us know about your experience today. Thank you! 

## 2018-03-02 ENCOUNTER — Encounter: Payer: Self-pay | Admitting: Emergency Medicine

## 2018-03-02 ENCOUNTER — Emergency Department: Payer: Medicaid Other

## 2018-03-02 ENCOUNTER — Emergency Department
Admission: EM | Admit: 2018-03-02 | Discharge: 2018-03-02 | Disposition: A | Discharge status: Home / Self Care | Payer: Medicaid Other | Attending: Emergency Medicine | Admitting: Emergency Medicine

## 2018-03-02 DIAGNOSIS — G43909 Migraine, unspecified, not intractable, without status migrainosus: Secondary | ICD-10-CM | POA: Insufficient documentation

## 2018-03-02 DIAGNOSIS — F17210 Nicotine dependence, cigarettes, uncomplicated: Secondary | ICD-10-CM | POA: Insufficient documentation

## 2018-03-02 DIAGNOSIS — Z91040 Latex allergy status: Secondary | ICD-10-CM | POA: Insufficient documentation

## 2018-03-02 DIAGNOSIS — R51 Headache: Secondary | ICD-10-CM | POA: Diagnosis present

## 2018-03-02 DIAGNOSIS — F4322 Adjustment disorder with anxiety: Secondary | ICD-10-CM | POA: Insufficient documentation

## 2018-03-02 DIAGNOSIS — G43009 Migraine without aura, not intractable, without status migrainosus: Secondary | ICD-10-CM

## 2018-03-02 LAB — POCT PREGNANCY, URINE: Preg Test, Ur: NEGATIVE

## 2018-03-02 MED ORDER — SODIUM CHLORIDE 0.9 % IV BOLUS
1000.0000 mL | Freq: Once | INTRAVENOUS | Status: AC
  Administered 2018-03-02: 1000 mL via INTRAVENOUS

## 2018-03-02 MED ORDER — METOCLOPRAMIDE HCL 5 MG/ML IJ SOLN
10.0000 mg | Freq: Once | INTRAMUSCULAR | Status: AC
  Administered 2018-03-02: 10 mg via INTRAVENOUS
  Filled 2018-03-02: qty 2

## 2018-03-02 MED ORDER — DIPHENHYDRAMINE HCL 50 MG/ML IJ SOLN
25.0000 mg | Freq: Once | INTRAMUSCULAR | Status: AC
  Administered 2018-03-02: 25 mg via INTRAVENOUS
  Filled 2018-03-02: qty 1

## 2018-03-02 MED ORDER — KETOROLAC TROMETHAMINE 30 MG/ML IJ SOLN
30.0000 mg | Freq: Once | INTRAMUSCULAR | Status: AC
  Administered 2018-03-02: 30 mg via INTRAVENOUS
  Filled 2018-03-02: qty 1

## 2018-03-02 MED ORDER — ONDANSETRON HCL 4 MG/2ML IJ SOLN
4.0000 mg | Freq: Once | INTRAMUSCULAR | Status: AC
  Administered 2018-03-02: 4 mg via INTRAVENOUS
  Filled 2018-03-02: qty 2

## 2018-03-02 NOTE — Discharge Instructions (Addendum)
Follow-up with your primary care provider if any continued problems.  Increase fluids.  Return to the emergency department if any severe worsening of your symptoms.

## 2018-03-02 NOTE — ED Triage Notes (Signed)
Pt reports woke up with a migraine this am. Pt reports history of migraines Pt reports that she takes excedrin for them but did not have any to take this am. Pt reports migraine is like the rest of them.

## 2018-03-02 NOTE — ED Provider Notes (Signed)
[facility] Emergency Department Provider Note  ____________________________________________   First MD Initiated Contact with Patient 03/02/18 1052     (approximate)  I have reviewed the triage vital signs and the nursing notes.   HISTORY  Chief Complaint Migraine   HPI Linda Thornton is a 27 y.o. female presents to the ED with complaint of right-sided migraine headache that started this morning when she woke up.  Patient states that she has had headaches similar to this has a history of "migraines".  Patient states that normally she can take Excedrin for her headaches but this morning has nausea and photophobia.  She denies any changes in her headache.  She has not been placed on any migraine prevention medication.  Patient also complains of cough with chills at night along with night sweats.  Patient denies any previous difficulties with bronchitis or pneumonia.  Patient does have a history of smoking.  She rates her pain as a 10/10.  Past Medical History:  Diagnosis Date  . Bulging lumbar disc   . Bursitis   . Migraine headache   . Renal disorder    kidney stone    Patient Active Problem List   Diagnosis Date Noted  . Tobacco abuse 04/27/2016  . Overweight (BMI 25.0-29.9) 04/27/2016  . Previous cesarean section 09/11/2014  . 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 Adjustment disorder with anxiety 11/15/2013  . Fibromyalgia 09/09/2011    Past Surgical History:  Procedure Laterality Date  . CESAREAN SECTION      Prior to Admission medications   Medication Sig Start Date End Date Taking? Authorizing Provider  amphetamine-dextroamphetamine (ADDERALL) 10 MG tablet TK 1 T PO D 12/19/17   [provider]  buprenorphine (SUBUTEX) 8 MG SUBL SL tablet DISSOLVE TID ON TONGUE 12/24/17   [provider]  NUVARING 0.12-0.015 MG/24HR vaginal ring INSERT 1 RING VAGINALLY FOR 3 WEEKS THEN  REMOVE FOR 1 WEEK 12/12/17   [provider]    Allergies Hydrocodone-acetaminophen and Latex  Family History  Problem Relation Age of Onset  . Irregular heart beat Mother   . Hypertension Mother   . Migraines Mother   . Rheum arthritis Maternal Grandmother   . Osteoarthritis Maternal Grandmother   . Diabetes Maternal Grandfather   . Rheum arthritis Maternal Grandfather   . Osteoarthritis Maternal Grandfather   . Heart disease Maternal Grandfather   . Other Brother 30       drug overdose    Social History Social History   Tobacco Use  . Smoking status: Current Every Day Smoker    Packs/day: 0.25    Types: Cigarettes  . Smokeless tobacco: Never Used  Substance Use Topics  . Alcohol use: No  . Drug use: No    Review of Systems Constitutional: No fever/chills Eyes: No photophobia. ENT: No sore throat.  Negative for ear pain. Cardiovascular: Denies chest pain. Respiratory: Denies shortness of breath.  Nonproductive cough. Gastrointestinal: No abdominal pain.  No nausea, no vomiting.   Genitourinary: Negative for dysuria. Musculoskeletal: Negative for back pain. Skin: Negative for rash. Neurological: Positive for headaches, negative for focal weakness or numbness. ___________________________________________   PHYSICAL EXAM:  VITAL SIGNS: ED Triage Vitals  Enc Vitals Group     BP 03/02/18 0919 (!) 118/91     Pulse Rate 03/02/18 0919 87     Resp 03/02/18 0919 20     Temp 03/02/18 0919 98.3 F (36.8 C)  Temp Source 03/02/18 0919 Oral     SpO2 03/02/18 0919 98 %     Weight 03/02/18 0920 117 lb (53.1 kg)     Height 03/02/18 0920 5\' 10"  (1.778 m)     Head Circumference --      Peak Flow --      Pain Score 03/02/18 0923 10     Pain Loc --      Pain Edu? --      Excl. in GC? --    Constitutional: Alert and oriented. Well appearing and in no acute distress.  Patient is able to answer questions appropriately. Eyes: Conjunctivae are normal. PERRL. EOMI.  positive photophobia. Head: Atraumatic. Nose: No congestion/rhinnorhea. Mouth/Throat: Mucous membranes are moist.  Oropharynx non-erythematous. Neck: No stridor.  Nontender cervical spine to palpation posteriorly. Cardiovascular: Normal rate, regular rhythm. Grossly normal heart sounds.  Good peripheral circulation. Respiratory: Normal respiratory effort.  No retractions. Lungs CTAB. Musculoskeletal: Moves upper and lower extremities with any difficulty.  Normal gait was noted. Neurologic:  Normal speech and language. No gross focal neurologic deficits are appreciated.  Cranial nerves II through XII grossly intact.  No gait instability. Skin:  Skin is warm, dry and intact. No rash noted. Psychiatric: Mood and affect are normal. Speech and behavior are normal.  ____________________________________________   LABS (all labs ordered are listed, but only abnormal results are displayed)  Labs Reviewed  POC URINE PREG, ED  POCT PREGNANCY, URINE    RADIOLOGY   Official radiology report(s): Dg Chest 2 View  Result Date: 03/02/2018 CLINICAL DATA:  Cough and chills.  Headache. EXAM: CHEST - 2 VIEW COMPARISON:  Chest CT December 22, 2010; chest radiograph March 13, 2009 FINDINGS: The lungs are clear. The heart size and pulmonary vascularity are normal. No adenopathy. No pneumothorax. No bone lesions. IMPRESSION: No edema or consolidation. Electronically Signed   By: Bretta BangWilliam  Woodruff III M.D.   On: 03/02/2018 11:59    ____________________________________________   PROCEDURES  Procedure(s) performed: None  Procedures  Critical Care performed: No  ____________________________________________   INITIAL IMPRESSION / ASSESSMENT AND PLAN / ED COURSE  As part of my medical decision making, I reviewed the following data within the electronic MEDICAL RECORD NUMBER Notes from prior ED visits and [place] Controlled Substance Database   Clinical Course as of Mar 02 1716  Mon Mar 02, 2018  1153 DG  Chest 2 View [MW]    Clinical Course User Index [MW] Blanca FriendWilmot, Max E, [place]ent-PA   Patient presents to the ED with complaint of her normal migraine headache.  She also complains of cough with night sweats.  She denies any weight loss.  Patient in the past has taken Excedrin with relief of her headache however she does not have any medication at home and came to the ED instead.  Chest x-ray was reassuring.  Patient was given Zofran for nausea.  Later an IV of normal saline was established.  She was given headache cocktail including Reglan, Benadryl, Toradol IV.  Patient improved and at the time of discharge was stating that she was hungry and ready to go home.  Patient is to follow-up with her PCP if any continued problems with headache or with her cough.  ____________________________________________   FINAL CLINICAL IMPRESSION(S) / ED DIAGNOSES  Final diagnoses:  Migraine without aura and without status migrainosus, not intractable     ED Discharge Orders    None       Note:  This document was prepared using  [employer] and may include unintentional dictation errors.    Tommi Rumps, PA-C 03/02/18 1717    Jene Every, MD 03/03/18 [phone]

## 2018-03-02 NOTE — ED Notes (Signed)
Pt taken to X-ray  States she has also been having night sweats and cough

## 2018-03-02 NOTE — ED Notes (Signed)
Pt verbalizes understanding of d/c instructions, medications and follow up 

## 2018-03-02 NOTE — ED Notes (Signed)
See triage note   States she woke up with headache this am  States pain is mainly behind right eye positive photosensitivity  Placed in dark room

## 2018-03-05 ENCOUNTER — Telehealth: Payer: Self-pay

## 2018-03-05 MED ORDER — NUVARING 0.12-0.015 MG/24HR VA RING: VAGINAL_RING | VAGINAL | 3 refills | Status: DC

## 2018-03-05 NOTE — Telephone Encounter (Signed)
Pt called stating pharm didn't have nuvaring rx.  I called pharm and gave a verbal order, spoke c pharmacist.  Pt aware.

## 2018-03-29 ENCOUNTER — Emergency Department
Admission: EM | Admit: 2018-03-29 | Discharge: 2018-03-29 | Disposition: A | Discharge status: Home / Self Care | Payer: Medicaid Other | Attending: Emergency Medicine | Admitting: Emergency Medicine

## 2018-03-29 ENCOUNTER — Other Ambulatory Visit: Payer: Self-pay

## 2018-03-29 ENCOUNTER — Emergency Department: Payer: Medicaid Other

## 2018-03-29 DIAGNOSIS — F17210 Nicotine dependence, cigarettes, uncomplicated: Secondary | ICD-10-CM | POA: Diagnosis not present

## 2018-03-29 DIAGNOSIS — Z79899 Other long term (current) drug therapy: Secondary | ICD-10-CM | POA: Insufficient documentation

## 2018-03-29 DIAGNOSIS — R102 Pelvic and perineal pain: Secondary | ICD-10-CM | POA: Insufficient documentation

## 2018-03-29 DIAGNOSIS — N938 Other specified abnormal uterine and vaginal bleeding: Secondary | ICD-10-CM | POA: Insufficient documentation

## 2018-03-29 DIAGNOSIS — N939 Abnormal uterine and vaginal bleeding, unspecified: Secondary | ICD-10-CM | POA: Diagnosis present

## 2018-03-29 LAB — URINALYSIS, COMPLETE (UACMP) WITH MICROSCOPIC
Bacteria, UA: NONE SEEN
Bilirubin Urine: NEGATIVE
Glucose, UA: NEGATIVE mg/dL
Ketones, ur: NEGATIVE mg/dL
Leukocytes, UA: NEGATIVE
Nitrite: NEGATIVE
Protein, ur: NEGATIVE mg/dL
Specific Gravity, Urine: 1.01 (ref 1.005–1.030)
pH: 7 (ref 5.0–8.0)

## 2018-03-29 LAB — WET PREP, GENITAL
Clue Cells Wet Prep HPF POC: NONE SEEN
Sperm: NONE SEEN
Trich, Wet Prep: NONE SEEN
Yeast Wet Prep HPF POC: NONE SEEN

## 2018-03-29 LAB — CBC
HCT: 47 % — ABNORMAL HIGH (ref 36.0–46.0)
Hemoglobin: 15.1 g/dL — ABNORMAL HIGH (ref 12.0–15.0)
MCH: 30.3 pg (ref 26.0–34.0)
MCHC: 32.1 g/dL (ref 30.0–36.0)
MCV: 94.2 fL (ref 80.0–100.0)
Platelets: 341 10*3/uL (ref 150–400)
RBC: 4.99 MIL/uL (ref 3.87–5.11)
RDW: 13.5 % (ref 11.5–15.5)
WBC: 9.6 10*3/uL (ref 4.0–10.5)
nRBC: 0 % (ref 0.0–0.2)

## 2018-03-29 LAB — CHLAMYDIA/NGC RT PCR (ARMC ONLY)
Chlamydia Tr: NOT DETECTED
N gonorrhoeae: NOT DETECTED

## 2018-03-29 LAB — POCT PREGNANCY, URINE: Preg Test, Ur: NEGATIVE

## 2018-03-29 LAB — HCG, QUANTITATIVE, PREGNANCY: hCG, Beta Chain, Quant, S: <1 m[IU]/mL (ref <5)

## 2018-03-29 NOTE — ED Provider Notes (Signed)
[facility] Emergency Department Provider Note  ___________________________________________   First MD Initiated Contact with Patient 03/29/18 1705     (approximate)  I have reviewed the triage vital signs and the nursing notes.   HISTORY  Chief Complaint Vaginal Bleeding   HPI Linda Thornton is a 27 y.o. female history of kidney stones was presented to the emergency department today with lower abdominal cramping as well as vaginal bleeding.  She says that she went approximate 1 week without having her NuvaRing and then yesterday started having abdominal cramping and passed a large mixture of blood and what she describes as "tissue" that looked like paper.  She says that she is not having any cramping today and she has not passed any more of the tissue like substance.  Says that she has minimal brown spotting today.  No burning with urination.  Patient says that she is a low suspicion for STDs.  Sexually active with one partner at this time.   Past Medical History:  Diagnosis Date  . Bulging lumbar disc   . Bursitis   . Migraine headache   . Renal disorder    kidney stone    Patient Active Problem List   Diagnosis Date Noted  . Tobacco abuse 04/27/2016  . Overweight (BMI 25.0-29.9) 04/27/2016  . Previous cesarean section 09/11/2014  . 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 Adjustment disorder with anxiety 11/15/2013  . Fibromyalgia 09/09/2011    Past Surgical History:  Procedure Laterality Date  . CESAREAN SECTION      Prior to Admission medications   Medication Sig Start Date End Date Taking? Authorizing Provider  amphetamine-dextroamphetamine (ADDERALL) 10 MG tablet TK 1 T PO D 12/19/17   [provider]  buprenorphine (SUBUTEX) 8 MG SUBL SL tablet DISSOLVE TID ON TONGUE 12/24/17   [provider]  NUVARING 0.12-0.015 MG/24HR vaginal ring INSERT 1 RING VAGINALLY FOR 3  WEEKS THEN REMOVE FOR 1 WEEK 03/05/18   Copland, Alicia B, PA-C    Allergies Hydrocodone-acetaminophen and Latex  Family History  Problem Relation Age of Onset  . Irregular heart beat Mother   . Hypertension Mother   . Migraines Mother   . Rheum arthritis Maternal Grandmother   . Osteoarthritis Maternal Grandmother   . Diabetes Maternal Grandfather   . Rheum arthritis Maternal Grandfather   . Osteoarthritis Maternal Grandfather   . Heart disease Maternal Grandfather   . Other Brother 30       drug overdose    Social History Social History   Tobacco Use  . Smoking status: Current Every Day Smoker    Packs/day: 0.25    Types: Cigarettes  . Smokeless tobacco: Never Used  Substance Use Topics  . Alcohol use: No  . Drug use: No    Review of Systems  Constitutional: No fever/chills Eyes: No visual changes. ENT: No sore throat. Cardiovascular: Denies chest pain. Respiratory: Denies shortness of breath. Gastrointestinal:   No nausea, no vomiting.  No diarrhea.  No constipation. Genitourinary: As above Musculoskeletal: Negative for back pain. Skin: Negative for rash. Neurological: Negative for headaches, focal weakness or numbness.   ____________________________________________   PHYSICAL EXAM:  VITAL SIGNS: ED Triage Vitals [03/29/18 1539]  Enc Vitals Group     BP 125/80     Pulse Rate 97     Resp 18     Temp 98.7 F (37.1 C)     Temp Source Oral  SpO2 99 %     Weight 117 lb (53.1 kg)     Height 5\' 5"  (1.651 m)     Head Circumference      Peak Flow      Pain Score 10     Pain Loc      Pain Edu?      Excl. in GC?     Constitutional: Alert and oriented. Well appearing and in no acute distress. Eyes: Conjunctivae are normal.  Head: Atraumatic. Nose: No congestion/rhinnorhea. Mouth/Throat: Mucous membranes are moist.  Neck: No stridor.   Cardiovascular: Normal rate, regular rhythm. Grossly normal heart sounds.  Respiratory: Normal respiratory  effort.  No retractions. Lungs CTAB. Gastrointestinal: Soft and nontender. No distention.  Genitourinary: Normal external exam without any lesions.  Speculum exam with a very small amount of maroon blood in the vault without any pooling or active bleeding at this time.  Bimanual exam without CMT.  Left adnexal tenderness to palpation without mass palpated.  No uterine or right adnexal tenderness. Musculoskeletal: No lower extremity tenderness nor edema.  No joint effusions. Neurologic:  Normal speech and language. No gross focal neurologic deficits are appreciated. Skin:  Skin is warm, dry and intact. No rash noted. Psychiatric: Mood and affect are normal. Speech and behavior are normal.  ____________________________________________   LABS (all labs ordered are listed, but only abnormal results are displayed)  Labs Reviewed  WET PREP, GENITAL - Abnormal; Notable for the following components:      Result Value   WBC, Wet Prep HPF POC PRESENT (*)    All other components within normal limits  CBC - Abnormal; Notable for the following components:   Hemoglobin 15.1 (*)    HCT 47.0 (*)    All other components within normal limits  CHLAMYDIA/NGC RT PCR ([facility] ONLY)  HCG, QUANTITATIVE, PREGNANCY  URINALYSIS, COMPLETE (UACMP) WITH MICROSCOPIC  POC URINE PREG, ED  POCT PREGNANCY, URINE   ____________________________________________  EKG   ____________________________________________  RADIOLOGY  Negative ultrasound of the pelvis without any masses. ____________________________________________   PROCEDURES  Procedure(s) performed:   Procedures  Critical Care performed:   ____________________________________________   INITIAL IMPRESSION / ASSESSMENT AND PLAN / ED COURSE  Pertinent labs & imaging results that were available during my care of the patient were reviewed by me and considered in my medical decision making (see chart for details).  Differential diagnosis  includes, but is not limited to, ovarian cyst, ovarian torsion, acute appendicitis, diverticulitis, urinary tract infection/pyelonephritis, endometriosis, bowel obstruction, colitis, renal colic, gastroenteritis, hernia, fibroids, endometriosis, pregnancy related pain including ectopic pregnancy, etc. As part of my medical decision making, I reviewed the following data within the electronic MEDICAL RECORD NUMBER Notes from prior ED visits  ----------------------------------------- 7:36 PM on 03/29/2018 -----------------------------------------  Patient at this time without any further complaint.  Very reassuring work-up including her wet prep as well as her ultrasound which came back as negative.  Likely symptomatic from 1 week break and using her NuvaRing.  Patient understanding of the diagnosis as well as treatment and willing to comply.  Will be discharged at this time. ____________________________________________   FINAL CLINICAL IMPRESSION(S) / ED DIAGNOSES  Final diagnoses:  Pelvic pain  Dysfunctional uterine bleeding      NEW MEDICATIONS STARTED DURING THIS VISIT:  New Prescriptions   No medications on file     Note:  This document was prepared using Dragon voice recognition software and may include unintentional dictation errors.     Montee Tallman,  Myra Rude, MD 03/29/18 [phone]

## 2018-03-29 NOTE — ED Notes (Signed)
Pt to ultrasound

## 2018-03-29 NOTE — ED Notes (Signed)
Negative POC 

## 2018-03-29 NOTE — ED Triage Notes (Addendum)
Pt comes via POV from home with c/o vaginal bleeding. Pt states she is on her menstrual cycle and has cramping but this cycle is different from past one with more bleeding, clots and severe cramping.Pt  states yesterday she had a lot of dark bleeding and it has continued today. Pt states some clots and tissue visible. Pt unsure if pregnant.

## 2018-04-07 ENCOUNTER — Ambulatory Visit: Payer: Self-pay | Admitting: Adult Health

## 2018-05-05 ENCOUNTER — Other Ambulatory Visit: Payer: Self-pay

## 2018-05-05 ENCOUNTER — Telehealth: Payer: Self-pay

## 2018-05-05 MED ORDER — NUVARING 0.12-0.015 MG/24HR VA RING: VAGINAL_RING | VAGINAL | 0 refills | Status: DC

## 2018-05-05 NOTE — Telephone Encounter (Signed)
Pt calling for refill on nuva ring. Please call pt to get annual scheduled, then we can refill this. [facility]. But [facility] last note says she needed to come back for annual around 11/9 so she is past due. Thank you

## 2018-05-05 NOTE — Telephone Encounter (Signed)
RF to last to annual sent in. Pt aware.

## 2018-05-05 NOTE — Telephone Encounter (Signed)
Patient is schedule 06/02/18 with [facility] for Annual. Patient is requesting refill to get to her appointment. Please advise

## 2018-06-01 NOTE — Progress Notes (Deleted)
PCP:  [facility], [facility]   No chief complaint on file.    HPI:      [name] is a 27 y.o. [id number] who LMP was No LMP recorded., presents today for her annual examination.  Her menses are {norm/abn:715}, lasting {number:22536} days.  Dysmenorrhea {dysmen:716}. She {does:18564} have intermenstrual bleeding.  Sex activity: single partner, contraception - NuvaRing vaginal inserts.  Last Pap: April 25, 2016  Results were: no abnormalities  Hx of STDs: {STD hx:14358}  There is no FH of breast cancer. There is no FH of ovarian cancer. The patient {does:18564} do self-breast exams.  Tobacco use: {tob:20664} Alcohol use: {Alcohol:11675} No drug use.  Exercise: {exercise:31265}  She {does:18564} get adequate calcium and Vitamin D in her diet.   Past Medical History:  Diagnosis Date  . Bulging lumbar disc   . Bursitis   . Migraine headache   . Renal disorder    kidney stone  . Trichomonal vulvovaginitis 04/2013    Past Surgical History:  Procedure Laterality Date  . CESAREAN SECTION      Family History  Problem Relation Age of Onset  . Irregular heart beat Mother   . Hypertension Mother   . Migraines Mother   . Rheum arthritis Maternal Grandmother   . Osteoarthritis Maternal Grandmother   . Diabetes Maternal Grandfather   . Rheum arthritis Maternal Grandfather   . Osteoarthritis Maternal Grandfather   . Heart disease Maternal Grandfather   . Other Brother 30       drug overdose  . Hyperlipidemia Father   . Hypertension Father   . Polycystic ovary syndrome Sister     Social History   Socioeconomic History  . Marital status: Single    Spouse name: Not on file  . Number of children: Not on file  . Years of education: Not on file  . Highest education level: Not on file  Occupational History  . Not on file  Social Needs  . Financial resource strain: Not on file  . Food insecurity:    Worry: Not on file    Inability: Not on file  .  Transportation needs:    Medical: Not on file    Non-medical: Not on file  Tobacco Use  . Smoking status: Current Every Day Smoker    Packs/day: 0.25    Types: Cigarettes  . Smokeless tobacco: Never Used  Substance and Sexual Activity  . Alcohol use: No  . Drug use: No  . Sexual activity: Yes    Partners: Male    Birth control/protection: Inserts  Lifestyle  . Physical activity:    Days per week: Not on file    Minutes per session: Not on file  . Stress: Not on file  Relationships  . Social connections:    Talks on phone: Not on file    Gets together: Not on file    Attends religious service: Not on file    Active member of club or organization: Not on file    Attends meetings of clubs or organizations: Not on file    Relationship status: Not on file  . Intimate partner violence:    Fear of current or ex partner: Not on file    Emotionally abused: Not on file    Physically abused: Not on file    Forced sexual activity: Not on file  Other Topics Concern  . Not on file  Social History Narrative  . Not on file  Outpatient Medications Prior to Visit  Medication Sig Dispense Refill  . amphetamine-dextroamphetamine (ADDERALL) 10 MG tablet TK 1 T PO D  0  . buprenorphine (SUBUTEX) 8 MG SUBL SL tablet DISSOLVE TID ON TONGUE  0  . NUVARING 0.12-0.015 MG/24HR vaginal ring INSERT 1 RING VAGINALLY FOR 3 WEEKS THEN REMOVE FOR 1 WEEK 3 each 0   No facility-administered medications prior to visit.       ROS:  Review of Systems BREAST: No symptoms   Objective: There were no vitals taken for this visit.   OBGyn Exam  Results: No results found for this or any previous visit (from the past 24 hour(s)).  Assessment/Plan: No diagnosis found.  No orders of the defined types were placed in this encounter.            GYN counsel {counseling:16159}     F/U  No follow-ups on file.  Fradel Baldonado B. Ashna Dorough, PA-C 06/01/2018 5:08 PM

## 2018-06-02 ENCOUNTER — Ambulatory Visit: Payer: Medicaid Other | Admitting: Obstetrics and Gynecology

## 2018-06-23 ENCOUNTER — Ambulatory Visit: Payer: Medicaid Other | Admitting: Obstetrics and Gynecology

## 2018-06-23 NOTE — Progress Notes (Deleted)
PCP:  [facility]   No chief complaint on file.    HPI:      [name] is a 28 y.o. [id number] who LMP was No LMP recorded., presents today for her annual examination.  Her menses are {norm/abn:715}, lasting {number:22536} days.  Dysmenorrhea {dysmen:716}. She {does:18564} have intermenstrual bleeding.  Sex activity: {sex active:315163}.  Last Pap: April 25, 2016  Results were: no abnormalities  Hx of STDs: {STD hx:14358}  There is no FH of breast cancer. There is no FH of ovarian cancer. The patient {does:18564} do self-breast exams.  Tobacco use: {tob:20664} Alcohol use: {Alcohol:11675} No drug use.  Exercise: {exercise:31265}  She {does:18564} get adequate calcium and Vitamin D in her diet.   Past Medical History:  Diagnosis Date  . Bulging lumbar disc   . Bursitis   . Migraine headache   . Renal disorder    kidney stone  . Trichomonal vulvovaginitis 04/2013    Past Surgical History:  Procedure Laterality Date  . CESAREAN SECTION      Family History  Problem Relation Age of Onset  . Irregular heart beat Mother   . Hypertension Mother   . Migraines Mother   . Rheum arthritis Maternal Grandmother   . Osteoarthritis Maternal Grandmother   . Diabetes Maternal Grandfather   . Rheum arthritis Maternal Grandfather   . Osteoarthritis Maternal Grandfather   . Heart disease Maternal Grandfather   . Other Brother 30       drug overdose  . Hyperlipidemia Father   . Hypertension Father   . Polycystic ovary syndrome Sister     Social History   Socioeconomic History  . Marital status: Single    Spouse name: Not on file  . Number of children: Not on file  . Years of education: Not on file  . Highest education level: Not on file  Occupational History  . Not on file  Social Needs  . Financial resource strain: Not on file  . Food insecurity:    Worry: Not on file    Inability: Not on file  . Transportation needs:    Medical: Not  on file    Non-medical: Not on file  Tobacco Use  . Smoking status: Current Every Day Smoker    Packs/day: 0.25    Types: Cigarettes  . Smokeless tobacco: Never Used  Substance and Sexual Activity  . Alcohol use: No  . Drug use: No  . Sexual activity: Yes    Partners: Male    Birth control/protection: Inserts  Lifestyle  . Physical activity:    Days per week: Not on file    Minutes per session: Not on file  . Stress: Not on file  Relationships  . Social connections:    Talks on phone: Not on file    Gets together: Not on file    Attends religious service: Not on file    Active member of club or organization: Not on file    Attends meetings of clubs or organizations: Not on file    Relationship status: Not on file  . Intimate partner violence:    Fear of current or ex partner: Not on file    Emotionally abused: Not on file    Physically abused: Not on file    Forced sexual activity: Not on file  Other Topics Concern  . Not on file  Social History Narrative  . Not on file    Outpatient Medications Prior to Visit  Medication Sig Dispense Refill  . amphetamine-dextroamphetamine (ADDERALL) 10 MG tablet TK 1 T PO D  0  . buprenorphine (SUBUTEX) 8 MG SUBL SL tablet DISSOLVE TID ON TONGUE  0  . NUVARING 0.12-0.015 MG/24HR vaginal ring INSERT 1 RING VAGINALLY FOR 3 WEEKS THEN REMOVE FOR 1 WEEK 3 each 0   No facility-administered medications prior to visit.       ROS:  Review of Systems BREAST: No symptoms   Objective: There were no vitals taken for this visit.   OBGyn Exam  Results: No results found for this or any previous visit (from the past 24 hour(s)).  Assessment/Plan: No diagnosis found.  No orders of the defined types were placed in this encounter.            GYN counsel {counseling:16159}     F/U  No follow-ups on file.  Embry Huss B. Estel Tonelli, PA-C 06/23/2018 10:53 AM

## 2018-07-07 ENCOUNTER — Ambulatory Visit (INDEPENDENT_AMBULATORY_CARE_PROVIDER_SITE_OTHER): Payer: Medicaid Other | Admitting: Obstetrics and Gynecology

## 2018-07-07 ENCOUNTER — Other Ambulatory Visit (HOSPITAL_COMMUNITY)
Admission: RE | Admit: 2018-07-07 | Discharge: 2018-07-07 | Disposition: A | Discharge status: Home / Self Care | Payer: Medicaid Other | Source: Ambulatory Visit | Attending: Obstetrics and Gynecology | Admitting: Obstetrics and Gynecology

## 2018-07-07 ENCOUNTER — Encounter: Payer: Self-pay | Admitting: Obstetrics and Gynecology

## 2018-07-07 VITALS — BP 120/90 | HR 111 | Ht 62.0 in | Wt 115.0 lb

## 2018-07-07 DIAGNOSIS — N921 Excessive and frequent menstruation with irregular cycle: Secondary | ICD-10-CM

## 2018-07-07 DIAGNOSIS — Z124 Encounter for screening for malignant neoplasm of cervix: Secondary | ICD-10-CM

## 2018-07-07 DIAGNOSIS — Z113 Encounter for screening for infections with a predominantly sexual mode of transmission: Secondary | ICD-10-CM | POA: Insufficient documentation

## 2018-07-07 DIAGNOSIS — Z3202 Encounter for pregnancy test, result negative: Secondary | ICD-10-CM

## 2018-07-07 DIAGNOSIS — N898 Other specified noninflammatory disorders of vagina: Secondary | ICD-10-CM

## 2018-07-07 DIAGNOSIS — Z0000 Encounter for general adult medical examination without abnormal findings: Secondary | ICD-10-CM

## 2018-07-07 DIAGNOSIS — Z975 Presence of (intrauterine) contraceptive device: Secondary | ICD-10-CM

## 2018-07-07 DIAGNOSIS — Z01419 Encounter for gynecological examination (general) (routine) without abnormal findings: Secondary | ICD-10-CM

## 2018-07-07 DIAGNOSIS — Z3044 Encounter for surveillance of vaginal ring hormonal contraceptive device: Secondary | ICD-10-CM

## 2018-07-07 LAB — POCT URINE PREGNANCY: Preg Test, Ur: NEGATIVE

## 2018-07-07 MED ORDER — ETONOGESTREL-ETHINYL ESTRADIOL 0.12-0.015 MG/24HR VA RING: VAGINAL_RING | VAGINAL | 3 refills | Status: DC

## 2018-07-07 MED ORDER — VALACYCLOVIR HCL 1 G PO TABS: 1000.0000 mg | ORAL_TABLET | Freq: Two times a day (BID) | ORAL | 0 refills | Status: AC

## 2018-07-07 NOTE — Patient Instructions (Signed)
I value your feedback and entrusting us with your care. If you get a [place] patient survey, I would appreciate you taking the time to let us know about your experience today. Thank you! 

## 2018-07-07 NOTE — Progress Notes (Signed)
PCP:  [facility]   Chief Complaint  Patient presents with  . Gynecologic Exam    would like upt, having vaginal soreness x 1 month     HPI:      [name] is a 28 y.o. [phone] who LMP was No LMP recorded. (Menstrual status: Other)., presents today for her annual examination.  Her menses are regular every 28-30 days, lasting 4 days.  Dysmenorrhea mild, occurring first 1-2 days of flow. She does not have intermenstrual bleeding but has had bleeding a couple days before placebo wk the past 2 cycles. Pt didn't put in new nuvaring this past Sat, 1/25 because of fear of pregnancy.   Sex activity: single partner, contraception - NuvaRing vaginal inserts. Pt has noticed a painful area vaginally over the past 3 months, since new sex partner. Vaginal tissue hurts and urine burns area when hits. Sx resolve if pt stops having sex for a few days and then recurs once active again. No hx of HSV.  Last Pap: April 25, 2016  Results were: no abnormalities /neg HPV DNA  Hx of STDs: trichomonas  There is no FH of breast cancer. There is no FH of ovarian cancer. The patient does not do self-breast exams.  Tobacco use: The patient currently smokes 1 packs of cigarettes per day for the past several years. Alcohol use: none No drug use.  Exercise: not active  She does get adequate calcium and Vitamin D in her diet.   Past Medical History:  Diagnosis Date  . Bulging lumbar disc   . Bursitis   . Migraine headache   . Renal disorder    kidney stone  . Trichomonal vulvovaginitis 04/2013    Past Surgical History:  Procedure Laterality Date  . CESAREAN SECTION      Family History  Problem Relation Age of Onset  . Irregular heart beat Mother   . Hypertension Mother   . Migraines Mother   . Rheum arthritis Maternal Grandmother   . Osteoarthritis Maternal Grandmother   . Diabetes Maternal Grandfather   . Rheum arthritis Maternal Grandfather   . Osteoarthritis  Maternal Grandfather   . Heart disease Maternal Grandfather   . Other Brother 30       drug overdose  . Hyperlipidemia Father   . Hypertension Father   . Polycystic ovary syndrome Sister     Social History   Socioeconomic History  . Marital status: Single    Spouse name: Not on file  . Number of children: Not on file  . Years of education: Not on file  . Highest education level: Not on file  Occupational History  . Not on file  Social Needs  . Financial resource strain: Not on file  . Food insecurity:    Worry: Not on file    Inability: Not on file  . Transportation needs:    Medical: Not on file    Non-medical: Not on file  Tobacco Use  . Smoking status: Current Every Day Smoker    Packs/day: 0.25    Types: Cigarettes  . Smokeless tobacco: Never Used  Substance and Sexual Activity  . Alcohol use: No  . Drug use: No  . Sexual activity: Yes    Partners: Male    Birth control/protection: Inserts    Comment: Nuvaring  Lifestyle  . Physical activity:    Days per week: Not on file    Minutes per session: Not on file  . Stress:  Not on file  Relationships  . Social connections:    Talks on phone: Not on file    Gets together: Not on file    Attends religious service: Not on file    Active member of club or organization: Not on file    Attends meetings of clubs or organizations: Not on file    Relationship status: Not on file  . Intimate partner violence:    Fear of current or ex partner: Not on file    Emotionally abused: Not on file    Physically abused: Not on file    Forced sexual activity: Not on file  Other Topics Concern  . Not on file  Social History Narrative  . Not on file    Outpatient Medications Prior to Visit  Medication Sig Dispense Refill  . NUVARING 0.12-0.015 MG/24HR vaginal ring INSERT 1 RING VAGINALLY FOR 3 WEEKS THEN REMOVE FOR 1 WEEK 3 each 0  . amphetamine-dextroamphetamine (ADDERALL) 10 MG tablet TK 1 T PO D  0  . buprenorphine  (SUBUTEX) 8 MG SUBL SL tablet DISSOLVE TID ON TONGUE  0   No facility-administered medications prior to visit.       ROS:  Review of Systems  Constitutional: Negative for fatigue, fever and unexpected weight change.  Respiratory: Negative for cough, shortness of breath and wheezing.   Cardiovascular: Negative for chest pain, palpitations and leg swelling.  Gastrointestinal: Positive for nausea. Negative for blood in stool, constipation, diarrhea and vomiting.  Endocrine: Negative for cold intolerance, heat intolerance and polyuria.  Genitourinary: Positive for dyspareunia and vaginal pain. Negative for dysuria, flank pain, frequency, genital sores, hematuria, menstrual problem, pelvic pain, urgency, vaginal bleeding and vaginal discharge.  Musculoskeletal: Negative for back pain, joint swelling and myalgias.  Skin: Negative for rash.  Neurological: Positive for dizziness. Negative for syncope, light-headedness, numbness and headaches.  Hematological: Negative for adenopathy.  Psychiatric/Behavioral: Positive for dysphoric mood. Negative for agitation, confusion, decreased concentration, sleep disturbance and suicidal ideas. The patient is not nervous/anxious.    BREAST: No symptoms   Objective: BP 120/90   Pulse (!) 111   Ht 5\' 2"  (1.575 m)   Wt 115 lb (52.2 kg)   BMI 21.03 kg/m    Physical Exam Constitutional:      Appearance: She is well-developed.  Genitourinary:     Vulva, cervix, uterus, right adnexa and left adnexa normal.     No vulval lesion or tenderness noted.        There are lesions in the vagina.     No vaginal discharge, erythema or tenderness.     No cervical polyp.     Uterus is not enlarged or tender.     No right or left adnexal mass present.     Right adnexa not tender.     Left adnexa not tender.  Neck:     Musculoskeletal: Normal range of motion.     Thyroid: No thyromegaly.  Cardiovascular:     Rate and Rhythm: Normal rate and regular  rhythm.     Heart sounds: Normal heart sounds. No murmur.  Pulmonary:     Effort: Pulmonary effort is normal.     Breath sounds: Normal breath sounds.  Chest:     Breasts:        Right: No mass, nipple discharge, skin change or tenderness.        Left: No mass, nipple discharge, skin change or tenderness.  Abdominal:     Palpations: Abdomen  is soft.     Tenderness: There is no abdominal tenderness. There is no guarding.  Musculoskeletal: Normal range of motion.  Neurological:     Mental Status: She is alert and oriented to person, place, and time.     Cranial Nerves: No cranial nerve deficit.  Psychiatric:        Behavior: Behavior normal.  Vitals signs reviewed.     Results: Results for orders placed or performed in visit on 07/07/18 (from the past 24 hour(s))  POCT urine pregnancy     Status: Normal   Collection Time: 07/07/18  2:27 PM  Result Value Ref Range   Preg Test, Ur Negative Negative    Assessment/Plan: Encounter for annual routine gynecological examination  Cervical cancer screening - Plan: Cytology - PAP, CANCELED: Cytology - PAP  Screening for STD (sexually transmitted disease) - Plan: Cytology - PAP  Encounter for surveillance of vaginal ring hormonal contraceptive device - Nuvaring RF.  - Plan: etonogestrel-ethinyl estradiol (NUVARING) 0.12-0.015 MG/24HR vaginal ring  Vaginal lesion - Question herpes by clinical exam/sx. Check culture. Will call with resutls. Rx valtrex in meantime.  - Plan: valACYclovir (VALTREX) 1000 MG tablet, Other/Misc lab test  Breakthrough bleeding with NuvaRing - Neg UPT. Check STD testing. If neg, follow sx. [facility]  - Plan: POCT urine pregnancy  Meds ordered this encounter  Medications  . valACYclovir (VALTREX) 1000 MG tablet    Sig: Take 1 tablet (1,000 mg total) by mouth 2 (two) times daily for 10 days.    Dispense:  20 tablet    Refill:  0    Order Specific Question:   Supervising Provider    Answer:    Nadara MustardHARRIS, ROBERT P [id number]]  . etonogestrel-ethinyl estradiol (NUVARING) 0.12-0.015 MG/24HR vaginal ring    Sig: INSERT 1 RING VAGINALLY FOR 3 WEEKS THEN REMOVE FOR 1 WEEK    Dispense:  3 each    Refill:  3    Order Specific Question:   Supervising Provider    Answer:   Nadara MustardHARRIS, ROBERT P [[id number]]             GYN counsel STD prevention, adequate intake of calcium and vitamin D, diet and exercise     F/U  Return in about 1 year (around 07/08/2019).  Tabria Steines B. Naveyah Iacovelli, PA-C 07/07/2018 2:51 PM

## 2018-07-10 LAB — CYTOLOGY - PAP
Adequacy: ABSENT
Chlamydia: NEGATIVE
Diagnosis: NEGATIVE
Neisseria Gonorrhea: NEGATIVE

## 2018-07-13 ENCOUNTER — Telehealth: Payer: Self-pay | Admitting: Obstetrics and Gynecology

## 2018-07-13 DIAGNOSIS — N898 Other specified noninflammatory disorders of vagina: Secondary | ICD-10-CM

## 2018-07-13 NOTE — Telephone Encounter (Signed)
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 Pt states area not changed since starting valtrex. If IgG neg, will eval further. 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 Wouldn't be type 1 given recurrent nature of lesions.

## 2018-11-04 ENCOUNTER — Ambulatory Visit: Payer: Medicaid Other | Admitting: Certified Nurse Midwife

## 2018-11-04 ENCOUNTER — Telehealth: Payer: Self-pay | Admitting: Obstetrics and Gynecology

## 2018-11-04 NOTE — Telephone Encounter (Signed)
I sent in 1 yr's worth at 1/20 annual. Can you see if Rx is at pharmacy? If not, you can refill till 1/21 annual. Thx.

## 2018-11-04 NOTE — Telephone Encounter (Signed)
Patient is requesting refill on etonogestrel-ethinyl estradiol (NUVARING) 0.12-0.015 MG/24HR vaginal ring. [facility] on [address].

## 2018-11-04 NOTE — Telephone Encounter (Signed)
[facility] pharmacy, pt still has 2 brand nuvaring RF's and 3 RF's on the generic nuvaring. Called pt to let her know, busy tone the 3 times I tried.

## 2018-11-05 ENCOUNTER — Encounter: Payer: Self-pay | Admitting: Certified Nurse Midwife

## 2018-11-05 ENCOUNTER — Other Ambulatory Visit (HOSPITAL_COMMUNITY)
Admission: RE | Admit: 2018-11-05 | Discharge: 2018-11-05 | Disposition: A | Discharge status: Home / Self Care | Payer: Medicaid Other | Source: Ambulatory Visit | Attending: Certified Nurse Midwife | Admitting: Certified Nurse Midwife

## 2018-11-05 ENCOUNTER — Ambulatory Visit (INDEPENDENT_AMBULATORY_CARE_PROVIDER_SITE_OTHER): Payer: Medicaid Other | Admitting: Certified Nurse Midwife

## 2018-11-05 ENCOUNTER — Other Ambulatory Visit: Payer: Self-pay

## 2018-11-05 VITALS — BP 100/60 | Ht 62.0 in | Wt 115.2 lb

## 2018-11-05 DIAGNOSIS — B9689 Other specified bacterial agents as the cause of diseases classified elsewhere: Secondary | ICD-10-CM

## 2018-11-05 DIAGNOSIS — N760 Acute vaginitis: Secondary | ICD-10-CM

## 2018-11-05 DIAGNOSIS — Z113 Encounter for screening for infections with a predominantly sexual mode of transmission: Secondary | ICD-10-CM

## 2018-11-05 DIAGNOSIS — N898 Other specified noninflammatory disorders of vagina: Secondary | ICD-10-CM | POA: Insufficient documentation

## 2018-11-05 LAB — POCT WET PREP (WET MOUNT): Trichomonas Wet Prep HPF POC: ABSENT

## 2018-11-05 MED ORDER — FLUCONAZOLE 150 MG PO TABS: ORAL_TABLET | ORAL | 0 refills | Status: DC

## 2018-11-05 MED ORDER — METRONIDAZOLE 500 MG PO TABS: 500.0000 mg | ORAL_TABLET | Freq: Two times a day (BID) | ORAL | 0 refills | Status: AC

## 2018-11-05 NOTE — Progress Notes (Signed)
Obstetrics & Gynecology Office Visit   Chief Complaint:  Chief Complaint  Patient presents with  . Vaginal Discharge    with sour odor, no itchiness/irritation for the last two weeks  . Contraception    RF on BC    History of Present Illness: 28 year old G3 P3003, LMP 10/31/2018, presents with complaints of vaginal discharge x 2 weeks. The discharge is white with an odor. Feels some vulvovaginal irritation, but no itching or dysuria. She wants STD testing also. Broke up with last partner 3 weeks ago after finding out about infidelity. Contraception: Nuvaring   Review of Systems:  ROS -see HPI  Past Medical History:  Past Medical History:  Diagnosis Date  . Anxiety   . Bipolar 1 disorder (HCC)   . Bulging lumbar disc   . Bursitis   . Migraine headache   . Renal disorder    kidney stone  . Substance abuse (HCC)    cocaine, clonazapam, hydrocodone, oxycodone  . Trichomonal vulvovaginitis 04/2013    Past Surgical History:  Past Surgical History:  Procedure Laterality Date  . CESAREAN SECTION      Gynecologic History: Patient's last menstrual period was 10/31/2018 (exact date).  Obstetric History: [id number]  Family History:  Family History  Problem Relation Age of Onset  . Irregular heart beat Mother   . Hypertension Mother   . Migraines Mother   . Rheum arthritis Maternal Grandmother   . Osteoarthritis Maternal Grandmother   . Diabetes Maternal Grandfather   . Rheum arthritis Maternal Grandfather   . Osteoarthritis Maternal Grandfather   . Heart disease Maternal Grandfather   . Other Brother 30       drug overdose  . Hyperlipidemia Father   . Hypertension Father   . Polycystic ovary syndrome Sister     Social History:  Social History   Socioeconomic History  . Marital status: Single    Spouse name: Not on file  . Number of children: 3  . Years of education: Not on file  . Highest education level: Not on file  Occupational History  . Not on file   Social Needs  . Financial resource strain: Not on file  . Food insecurity:    Worry: Not on file    Inability: Not on file  . Transportation needs:    Medical: Not on file    Non-medical: Not on file  Tobacco Use  . Smoking status: Current Every Day Smoker    Packs/day: 0.25    Types: Cigarettes  . Smokeless tobacco: Never Used  Substance and Sexual Activity  . Alcohol use: No  . Drug use: No    Comment: Hx of substance abuse: cocaine, benzodiazapines, oxycodone, hydrocodone  . Sexual activity: Not Currently    Partners: Male    Birth control/protection: Inserts    Comment: Nuvaring  Lifestyle  . Physical activity:    Days per week: Not on file    Minutes per session: Not on file  . Stress: Not on file  Relationships  . Social connections:    Talks on phone: Not on file    Gets together: Not on file    Attends religious service: Not on file    Active member of club or organization: Not on file    Attends meetings of clubs or organizations: Not on file    Relationship status: Not on file  . Intimate partner violence:    Fear of current or ex partner: Not on file  Emotionally abused: Not on file    Physically abused: Not on file    Forced sexual activity: Not on file  Other Topics Concern  . Not on file  Social History Narrative  . Not on file    Allergies:  Allergies  Allergen Reactions  . Hydrocodone-Acetaminophen Itching  . Latex Rash    Medications: Prior to Admission medications   Medication Sig Start Date End Date Taking? Authorizing Provider  etonogestrel-ethinyl estradiol (NUVARING) 0.12-0.015 MG/24HR vaginal ring INSERT 1 RING VAGINALLY FOR 3 WEEKS THEN REMOVE FOR 1 WEEK 07/07/18  Yes Copland, Ilona SorrelAlicia B, PA-C                  Physical Exam Vitals: BP 100/60   Ht 5\' 2"  (1.575 m)   Wt 115 lb 3.2 oz (52.3 kg)   LMP 10/31/2018 (Exact Date)   BMI 21.07 kg/m    Physical Exam  Constitutional: She appears well-developed and well-nourished. No  distress.  Respiratory: Effort normal.  Genitourinary:    Genitourinary Comments: Vulva: no inflammation, swelling or lesions Vagina: small amt white homogenous discharge Cervix: no lesions, nullip   Neurological:  Appears tired, initially was mumbling  Skin: Skin is warm and dry.  Psychiatric: She has a normal mood and affect.   Results for orders placed or performed in visit on 11/05/18 (from the past 24 hour(s))  POCT Wet Prep Mellody Drown([place]et [place])     Status: Abnormal   Collection Time: 11/05/18  9:43 PM  Result Value Ref Range   Source Wet Prep POC vagina    WBC, Wet Prep HPF POC     Bacteria Wet Prep HPF POC     BACTERIA WET PREP MORPHOLOGY POC     Clue Cells Wet Prep HPF POC Moderate (A) None   Clue Cells Wet Prep Whiff POC     Yeast Wet Prep HPF POC None None   KOH Wet Prep POC     Trichomonas Wet Prep HPF POC Absent Absent    Assessment: 28 y.o. [id number] with bacterial vaginosis Screening for STD  Plan: Flagyl 500 mgm BID x 7 days (no alcohol) with food Diflucan 150 mgm every 3 days x 2 doses for PPX RPR, HIV, Aptima RTO prn Nuvaring RX sent to pharmacy yesterday  Farrel Connersolleen Deniyah Dillavou, CNM

## 2018-11-06 LAB — RPR QUALITATIVE: RPR Ser Ql: NONREACTIVE

## 2018-11-06 LAB — HIV ANTIBODY (ROUTINE TESTING W REFLEX): HIV Screen 4th Generation wRfx: NONREACTIVE

## 2018-11-09 LAB — CERVICOVAGINAL ANCILLARY ONLY
Chlamydia: NEGATIVE
Neisseria Gonorrhea: NEGATIVE

## 2019-09-07 NOTE — Progress Notes (Deleted)
[facility]   No chief complaint on file.   HPI:      Linda Thornton is a 29 y.o. [phone] who LMP was No LMP recorded., presents today for STD testing  Hx of trich  Past Medical History:  Diagnosis Date  . Anxiety   . Bipolar 1 disorder ([place])   . Bulging lumbar disc   . Bursitis   . Migraine headache   . Renal disorder    kidney stone  . Substance abuse (HCC)    cocaine, clonazapam, hydrocodone, oxycodone  . Trichomonal vulvovaginitis 04/2013    Past Surgical History:  Procedure Laterality Date  . CESAREAN SECTION      Family History  Problem Relation Age of Onset  . Irregular heart beat Mother   . Hypertension Mother   . Migraines Mother   . Rheum arthritis Maternal Grandmother   . Osteoarthritis Maternal Grandmother   . Diabetes Maternal Grandfather   . Rheum arthritis Maternal Grandfather   . Osteoarthritis Maternal Grandfather   . Heart disease Maternal Grandfather   . Other Brother 30       drug overdose  . Hyperlipidemia Father   . Hypertension Father   . Polycystic ovary syndrome Sister     Social History   Socioeconomic History  . Marital status: Single    Spouse name: Not on file  . Number of children: 3  . Years of education: Not on file  . Highest education level: Not on file  Occupational History  . Not on file  Tobacco Use  . Smoking status: Current Every Day Smoker    Packs/day: 0.25    Types: Cigarettes  . Smokeless tobacco: Never Used  Substance and Sexual Activity  . Alcohol use: No  . Drug use: No    Comment: Hx of substance abuse: cocaine, benzodiazapines, oxycodone, hydrocodone  . Sexual activity: Not Currently    Partners: Male    Birth control/protection: Inserts    Comment: Nuvaring  Other Topics Concern  . Not on file  Social History Narrative  . Not on file   Social Determinants of Health   Financial Resource Strain:   . Difficulty of Paying Living Expenses:   Food Insecurity:   .  Worried About Charity fundraiser in the Last Year:   . Arboriculturist in the Last Year:   Transportation Needs:   . Film/video editor (Medical):   Marland Kitchen Lack of Transportation (Non-Medical):   Physical Activity:   . Days of Exercise per Week:   . Minutes of Exercise per Session:   Stress:   . Feeling of Stress :   Social Connections:   . Frequency of Communication with Friends and Family:   . Frequency of Social Gatherings with Friends and Family:   . Attends Religious Services:   . Active Member of Clubs or Organizations:   . Attends Archivist Meetings:   Marland Kitchen Marital Status:   Intimate Partner Violence:   . Fear of Current or Ex-Partner:   . Emotionally Abused:   Marland Kitchen Physically Abused:   . Sexually Abused:     Outpatient Medications Prior to Visit  Medication Sig Dispense Refill  . etonogestrel-ethinyl estradiol (NUVARING) 0.12-0.015 MG/24HR vaginal ring INSERT 1 RING VAGINALLY FOR 3 WEEKS THEN REMOVE FOR 1 WEEK 3 each 3  . fluconazole (DIFLUCAN) 150 MG tablet Take one tablet every 3 days x 2 doses 2 tablet 0   No  facility-administered medications prior to visit.      ROS:  Review of Systems BREAST: No symptoms   OBJECTIVE:   Vitals:  There were no vitals taken for this visit.  Physical Exam  Results: No results found for this or any previous visit (from the past 24 hour(s)).   Assessment/Plan: No diagnosis found.    No orders of the defined types were placed in this encounter.     No follow-ups on file.  Leelynn Whetsel B. Rebecca Motta, PA-C 09/07/2019 7:43 PM

## 2019-09-08 ENCOUNTER — Ambulatory Visit: Payer: Medicaid Other | Admitting: Obstetrics and Gynecology

## 2019-09-30 ENCOUNTER — Encounter: Payer: Self-pay | Admitting: Certified Nurse Midwife

## 2019-09-30 ENCOUNTER — Telehealth: Payer: Self-pay | Admitting: Certified Nurse Midwife

## 2019-09-30 ENCOUNTER — Other Ambulatory Visit: Payer: Self-pay

## 2019-09-30 ENCOUNTER — Other Ambulatory Visit (HOSPITAL_COMMUNITY)
Admission: RE | Admit: 2019-09-30 | Discharge: 2019-09-30 | Disposition: A | Discharge status: Home / Self Care | Payer: Medicaid Other | Source: Ambulatory Visit | Attending: Certified Nurse Midwife | Admitting: Certified Nurse Midwife

## 2019-09-30 ENCOUNTER — Ambulatory Visit (INDEPENDENT_AMBULATORY_CARE_PROVIDER_SITE_OTHER): Payer: Medicaid Other | Admitting: Certified Nurse Midwife

## 2019-09-30 VITALS — BP 114/66 | HR 78 | Ht 62.0 in | Wt 146.0 lb

## 2019-09-30 DIAGNOSIS — Z113 Encounter for screening for infections with a predominantly sexual mode of transmission: Secondary | ICD-10-CM

## 2019-09-30 DIAGNOSIS — N898 Other specified noninflammatory disorders of vagina: Secondary | ICD-10-CM

## 2019-09-30 DIAGNOSIS — Z8742 Personal history of other diseases of the female genital tract: Secondary | ICD-10-CM

## 2019-09-30 LAB — POCT URINE PREGNANCY: Preg Test, Ur: NEGATIVE

## 2019-09-30 MED ORDER — METRONIDAZOLE 500 MG PO TABS: 500.0000 mg | ORAL_TABLET | Freq: Two times a day (BID) | ORAL | 0 refills | Status: AC

## 2019-09-30 NOTE — Progress Notes (Signed)
GYN ENCOUNTER NOTE  Subjective:       Linda Thornton is a 29 y.o. G37P3003 female is here for gynecologic evaluation of the following issues:  1. Vaginal discharge for the last few months 2. Vaginal odor with discharge 3. Requests STI testing  Denies difficulty breathing or respiratory distress, chest pain, abdominal pain, excessive vaginal bleeding, dysuria, and leg pain or swelling.    Gynecologic History  Patient's last menstrual period was 09/01/2019 (approximate).  Contraception: none  Last Pap: 06/2018. Results were: normal  Obstetric History  OB History  Gravida Para Term Preterm AB Living  3 3 3     3   SAB TAB Ectopic Multiple Live Births          3    # Outcome Date GA Lbr Len/2nd Weight Sex Delivery Anes PTL Lv  3 Term 10/31/16   8 lb (3.629 kg) M CS-Unspec  N LIV  2 Term 10/12/14 [redacted]w[redacted]d  7 lb 6 oz (3.345 kg) M CS-Unspec  N LIV  1 Term 05/26/09 [redacted]w[redacted]d  7 lb 6 oz (3.345 kg) M CS-LTranv  N LIV     Complications: Fetal Intolerance, Thick meconium stained amniotic fluid    Obstetric Comments  G1 - C-section for FITL/ FTP (only dilated 1.5 cm, failed IOL).   G2- performed for repeat only    Past Medical History:  Diagnosis Date  . Anxiety   . Bipolar 1 disorder ([place])   . Bulging lumbar disc   . Bursitis   . Migraine headache   . Renal disorder    kidney stone  . Substance abuse (HCC)    cocaine, clonazapam, hydrocodone, oxycodone  . Trichomonal vulvovaginitis 04/2013    Past Surgical History:  Procedure Laterality Date  . CESAREAN SECTION      Current Outpatient Medications on File Prior to Visit  Medication Sig Dispense Refill  . etonogestrel-ethinyl estradiol (NUVARING) 0.12-0.015 MG/24HR vaginal ring INSERT 1 RING VAGINALLY FOR 3 WEEKS THEN REMOVE FOR 1 WEEK (Patient not taking: Reported on 09/30/2019) 3 each 3  . fluconazole (DIFLUCAN) 150 MG tablet Take one tablet every 3 days x 2 doses (Patient not taking: Reported on 09/30/2019) 2 tablet 0   No  current facility-administered medications on file prior to visit.    Allergies  Allergen Reactions  . Hydrocodone-Acetaminophen Itching  . Latex Rash    Social History   Socioeconomic History  . Marital status: Single    Spouse name: Not on file  . Number of children: 3  . Years of education: Not on file  . Highest education level: Not on file  Occupational History  . Not on file  Tobacco Use  . Smoking status: Current Every Day Smoker    Packs/day: 0.25    Types: Cigarettes  . Smokeless tobacco: Never Used  Substance and Sexual Activity  . Alcohol use: No  . Drug use: No    Comment: Hx of substance abuse: cocaine, benzodiazapines, oxycodone, hydrocodone  . Sexual activity: Yes    Partners: Male    Birth control/protection: None    Comment: Nuvaring  Other Topics Concern  . Not on file  Social History Narrative  . Not on file   Social Determinants of Health   Financial Resource Strain:   . Difficulty of Paying Living Expenses:   Food Insecurity:   . Worried About Charity fundraiser in the Last Year:   . Arboriculturist in the Last Year:   [employer]  Needs:   . Lack of Transportation (Medical):   Marland Kitchen Lack of Transportation (Non-Medical):   Physical Activity:   . Days of Exercise per Week:   . Minutes of Exercise per Session:   Stress:   . Feeling of Stress :   Social Connections:   . Frequency of Communication with Friends and Family:   . Frequency of Social Gatherings with Friends and Family:   . Attends Religious Services:   . Active Member of Clubs or Organizations:   . Attends Banker Meetings:   Marland Kitchen Marital Status:   Intimate Partner Violence:   . Fear of Current or Ex-Partner:   . Emotionally Abused:   Marland Kitchen Physically Abused:   . Sexually Abused:     Family History  Problem Relation Age of Onset  . Irregular heart beat Mother   . Hypertension Mother   . Migraines Mother   . Rheum arthritis Maternal Grandmother   . Osteoarthritis  Maternal Grandmother   . Diabetes Maternal Grandfather   . Rheum arthritis Maternal Grandfather   . Osteoarthritis Maternal Grandfather   . Heart disease Maternal Grandfather   . Other Brother 30       drug overdose  . Hyperlipidemia Father   . Hypertension Father   . Polycystic ovary syndrome Sister   . Diabetes Maternal Aunt     The following portions of the patient's history were reviewed and updated as appropriate: allergies, current medications, past family history, past medical history, past social history, past surgical history and problem list.  Review of Systems  ROS negative except as noted above. Information obtained from patient.   Objective:   BP 114/66   Pulse 78   Ht 5\' 2"  (1.575 m)   Wt 146 lb (66.2 kg)   LMP 09/01/2019 (Approximate)   BMI 26.70 kg/m   CONSTITUTIONAL: Well-developed, well-nourished female in no acute distress.   PELVIC:  External Genitalia: Normal  Vagina: Normal, swab collected   MUSCULOSKELETAL: Normal range of motion. No tenderness.  No cyanosis, clubbing, or edema.  Recent Results (from the past 2160 hour(s))  HIV antibody (with reflex)     Status: None   Collection Time: 09/30/19  9:36 AM  Result Value Ref Range   HIV Screen 4th Generation wRfx Non Reactive Non Reactive  RPR     Status: None   Collection Time: 09/30/19  9:36 AM  Result Value Ref Range   RPR Ser Ql Non Reactive Non Reactive  Hepatitis B Surface AntiGEN     Status: None   Collection Time: 09/30/19  9:36 AM  Result Value Ref Range   Hepatitis B Surface Ag Negative Negative  Hepatitis c antibody (reflex)     Status: None   Collection Time: 09/30/19  9:36 AM  Result Value Ref Range   HCV Ab <0.1 0.0 - 0.9 s/co ratio  HCV Comment:     Status: None   Collection Time: 09/30/19  9:36 AM  Result Value Ref Range   Comment: Comment     Comment: Non reactive HCV antibody screen is consistent with no HCV infection, unless recent infection is suspected or other  evidence exists to indicate HCV infection.   POCT urine pregnancy     Status: None   Collection Time: 09/30/19  9:55 AM  Result Value Ref Range   Preg Test, Ur Negative Negative     Assessment:   1. Vaginal discharge  - Cervicovaginal ancillary only  2. Screening for STD (sexually transmitted  disease)  - Cervicovaginal ancillary only - HIV antibody (with reflex) - RPR - Hepatitis B Surface AntiGEN - Hepatitis c antibody (reflex)  3. History of vaginitis  - Cervicovaginal ancillary only     Plan:   Vaginal swab collected, see orders.   Labs collected today.   Rx Flagyl, see orders.   Reviewed red flag symptoms and when to call.    Gunnar Bulla, CNM [facility]

## 2019-09-30 NOTE — Patient Instructions (Signed)
Preventive Care 21-28 Years Old, Female Preventive care refers to visits with your health care provider and lifestyle choices that can promote health and wellness. This includes:  A yearly physical exam. This may also be called an annual well check.  Regular dental visits and eye exams.  Immunizations.  Screening for certain conditions.  Healthy lifestyle choices, such as eating a healthy diet, getting regular exercise, not using drugs or products that contain nicotine and tobacco, and limiting alcohol use. What can I expect for my preventive care visit? Physical exam Your health care provider will check your:  Height and weight. This may be used to calculate body mass index (BMI), which tells if you are at a healthy weight.  Heart rate and blood pressure.  Skin for abnormal spots. Counseling Your health care provider may ask you questions about your:  Alcohol, tobacco, and drug use.  Emotional well-being.  Home and relationship well-being.  Sexual activity.  Eating habits.  Work and work environment.  Method of birth control.  Menstrual cycle.  Pregnancy history. What immunizations do I need?  Influenza (flu) vaccine  This is recommended every year. Tetanus, diphtheria, and pertussis (Tdap) vaccine  You may need a Td booster every 10 years. Varicella (chickenpox) vaccine  You may need this if you have not been vaccinated. Human papillomavirus (HPV) vaccine  If recommended by your health care provider, you may need three doses over 6 months. Measles, mumps, and rubella (MMR) vaccine  You may need at least one dose of MMR. You may also need a second dose. Meningococcal conjugate (MenACWY) vaccine  One dose is recommended if you are age 19-21 years and a first-year college student living in a residence hall, or if you have one of several medical conditions. You may also need additional booster doses. Pneumococcal conjugate (PCV13) vaccine  You may need  this if you have certain conditions and were not previously vaccinated. Pneumococcal polysaccharide (PPSV23) vaccine  You may need one or two doses if you smoke cigarettes or if you have certain conditions. Hepatitis A vaccine  You may need this if you have certain conditions or if you travel or work in places where you may be exposed to hepatitis A. Hepatitis B vaccine  You may need this if you have certain conditions or if you travel or work in places where you may be exposed to hepatitis B. Haemophilus influenzae type b (Hib) vaccine  You may need this if you have certain conditions. You may receive vaccines as individual doses or as more than one vaccine together in one shot (combination vaccines). Talk with your health care provider about the risks and benefits of combination vaccines. What tests do I need?  Blood tests  Lipid and cholesterol levels. These may be checked every 5 years starting at age 20.  Hepatitis C test.  Hepatitis B test. Screening  Diabetes screening. This is done by checking your blood sugar (glucose) after you have not eaten for a while (fasting).  Sexually transmitted disease (STD) testing.  BRCA-related cancer screening. This may be done if you have a family history of breast, ovarian, tubal, or peritoneal cancers.  Pelvic exam and Pap test. This may be done every 3 years starting at age 21. Starting at age 30, this may be done every 5 years if you have a Pap test in combination with an HPV test. Talk with your health care provider about your test results, treatment options, and if necessary, the need for more tests.   Follow these instructions at home: Eating and drinking   Eat a diet that includes fresh fruits and vegetables, whole grains, lean protein, and low-fat dairy.  Take vitamin and mineral supplements as recommended by your health care provider.  Do not drink alcohol if: ? Your health care provider tells you not to drink. ? You are  pregnant, may be pregnant, or are planning to become pregnant.  If you drink alcohol: ? Limit how much you have to 0-1 drink a day. ? Be aware of how much alcohol is in your drink. In the [place] one drink equals one 12 oz bottle of beer (355 mL), one 5 oz glass of wine (148 mL), or one 1 oz glass of hard liquor (44 mL). Lifestyle  Take daily care of your teeth and gums.  Stay active. Exercise for at least 30 minutes on 5 or more days each week.  Do not use any products that contain nicotine or tobacco, such as cigarettes, e-cigarettes, and chewing tobacco. If you need help quitting, ask your health care provider.  If you are sexually active, practice safe sex. Use a condom or other form of birth control (contraception) in order to prevent pregnancy and STIs (sexually transmitted infections). If you plan to become pregnant, see your health care provider for a preconception visit. What's next?  Visit your health care provider once a year for a well check visit.  Ask your health care provider how often you should have your eyes and teeth checked.  Stay up to date on all vaccines. This information is not intended to replace advice given to you by your health care provider. Make sure you discuss any questions you have with your health care provider. Document Revised: 02/05/2018 Document Reviewed: 02/05/2018 Elsevier Patient Education  [place].    Vaginitis  Vaginitis is irritation and swelling (inflammation) of the vagina. It happens when normal bacteria and yeast in the vagina grow too much. There are many types of this condition. Treatment will depend on the type you have. Follow these instructions at home: Lifestyle  Keep your vagina area clean and dry. ? Avoid using soap. ? Rinse the area with water.  Do not do the following until your doctor says it is okay: ? Wash and clean out the vagina (douche). ? Use tampons. ? Have sex.  Wipe from front to back after going  to the bathroom.  Let air reach your vagina. ? Wear cotton underwear. ? Do not wear:  Underwear while you sleep.  Tight pants.  Thong underwear.  Underwear or nylons without a cotton panel. ? Take off any wet clothing, such as bathing suits, as soon as possible.  Use gentle, non-scented products. Do not use things that can irritate the vagina, such as fabric softeners. Avoid the following products if they are scented: ? Feminine sprays. ? Detergents. ? Tampons. ? Feminine hygiene products. ? Soaps or bubble baths.  Practice safe sex and use condoms. General instructions  Take over-the-counter and prescription medicines only as told by your doctor.  If you were prescribed an antibiotic medicine, take or use it as told by your doctor. Do not stop taking or using the antibiotic even if you start to feel better.  Keep all follow-up visits as told by your doctor. This is important. Contact a doctor if:  You have pain in your belly.  You have a fever.  Your symptoms last for more than 2-3 days. Get help right away if:  You  have a fever and your symptoms get worse all of a sudden. Summary  Vaginitis is irritation and swelling of the vagina. It can happen when the normal bacteria and yeast in the vagina grow too much. There are many types.  Treatment will depend on the type you have.  Do not douche, use tampons , or have sex until your health care provider approves. When you can return to sex, practice safe sex and use condoms. This information is not intended to replace advice given to you by your health care provider. Make sure you discuss any questions you have with your health care provider. Document Revised: 05/09/2017 Document Reviewed: 06/18/2016 Elsevier Patient Education  2020 [employer].

## 2019-09-30 NOTE — Telephone Encounter (Signed)
Patient called in wanting an update on her prescription that was sent over to the [facility] pharmacy. Patient said they hadnt received it yet.

## 2019-09-30 NOTE — Telephone Encounter (Signed)
Rx resent to pharmacy on file, please inform patient. Thanks, JML

## 2019-10-01 LAB — HEPATITIS B SURFACE ANTIGEN: Hepatitis B Surface Ag: NEGATIVE

## 2019-10-01 LAB — HCV COMMENT:

## 2019-10-01 LAB — HEPATITIS C ANTIBODY (REFLEX): HCV Ab: <0.1 s/co ratio (ref 0.0–0.9)

## 2019-10-01 LAB — RPR: RPR Ser Ql: NONREACTIVE

## 2019-10-01 LAB — HIV ANTIBODY (ROUTINE TESTING W REFLEX): HIV Screen 4th Generation wRfx: NONREACTIVE

## 2019-10-04 LAB — CERVICOVAGINAL ANCILLARY ONLY
Bacterial Vaginitis (gardnerella): POSITIVE — AB
Candida Glabrata: NEGATIVE
Candida Vaginitis: NEGATIVE
Chlamydia: NEGATIVE
Comment: NEGATIVE
Comment: NEGATIVE
Comment: NEGATIVE
Comment: NEGATIVE
Comment: NEGATIVE
Comment: NORMAL
Neisseria Gonorrhea: NEGATIVE
Trichomonas: NEGATIVE

## 2019-10-12 ENCOUNTER — Telehealth: Payer: Self-pay | Admitting: Certified Nurse Midwife

## 2019-10-12 DIAGNOSIS — B9689 Other specified bacterial agents as the cause of diseases classified elsewhere: Secondary | ICD-10-CM

## 2019-10-12 NOTE — Telephone Encounter (Signed)
Patient called in saying she hasn't received a phone call regarding her test results. Patient doesn't have access to her MyChart so asked for the provider to give her a phone call. Before getting off the phone with patient I reset her MyChart password so she could get back into it.

## 2019-10-12 NOTE — Telephone Encounter (Signed)
Telephone call to patient, unable to leave to message due to full mailbox.   1236 Return call from patient, verified full name and date of birth. Results reviewed with patient, verbalized understanding. Advised to continue medication as prescribed.   Started menses, request beta next week to rule out pregnancy. Encouraged to schedule lab appointment as needed.   Reviewed red flag symptoms and when to call.   RTC as previously scheduled or sooner if needed.    Gunnar Bulla, CNM [facility], [facility] 10/12/19 12:39 PM

## 2019-10-23 ENCOUNTER — Other Ambulatory Visit: Payer: Self-pay

## 2019-10-23 ENCOUNTER — Emergency Department
Admission: EM | Admit: 2019-10-23 | Discharge: 2019-10-23 | Disposition: A | Discharge status: Home / Self Care | Payer: Medicaid Other | Attending: Emergency Medicine | Admitting: Emergency Medicine

## 2019-10-23 DIAGNOSIS — Z20822 Contact with and (suspected) exposure to covid-19: Secondary | ICD-10-CM | POA: Diagnosis not present

## 2019-10-23 DIAGNOSIS — J029 Acute pharyngitis, unspecified: Secondary | ICD-10-CM | POA: Insufficient documentation

## 2019-10-23 DIAGNOSIS — F17210 Nicotine dependence, cigarettes, uncomplicated: Secondary | ICD-10-CM | POA: Insufficient documentation

## 2019-10-23 LAB — GROUP A STREP BY PCR: Group A Strep by PCR: NOT DETECTED

## 2019-10-23 LAB — SARS CORONAVIRUS 2 BY RT PCR (HOSPITAL ORDER, PERFORMED IN ~~LOC~~ HOSPITAL LAB): SARS Coronavirus 2: NEGATIVE

## 2019-10-23 MED ORDER — ONDANSETRON 8 MG PO TBDP
8.0000 mg | ORAL_TABLET | Freq: Once | ORAL | Status: AC
  Administered 2019-10-23: 8 mg via ORAL
  Filled 2019-10-23: qty 1

## 2019-10-23 MED ORDER — LIDOCAINE VISCOUS HCL 2 % MT SOLN
5.0000 mL | Freq: Four times a day (QID) | OROMUCOSAL | 0 refills | Status: DC | PRN
Start: 2019-10-23 — End: 2019-12-20

## 2019-10-23 MED ORDER — PREDNISONE 20 MG PO TABS
60.0000 mg | ORAL_TABLET | Freq: Once | ORAL | Status: AC
  Administered 2019-10-23: 60 mg via ORAL
  Filled 2019-10-23: qty 3

## 2019-10-23 MED ORDER — METHYLPREDNISOLONE 4 MG PO TBPK
ORAL_TABLET | ORAL | 0 refills | Status: DC
Start: 2019-10-23 — End: 2019-12-20

## 2019-10-23 MED ORDER — DIPHENHYDRAMINE HCL 12.5 MG/5ML PO ELIX
12.5000 mg | ORAL_SOLUTION | Freq: Once | ORAL | Status: AC
  Administered 2019-10-23: 12.5 mg via ORAL
  Filled 2019-10-23: qty 5

## 2019-10-23 MED ORDER — LIDOCAINE VISCOUS HCL 2 % MT SOLN
15.0000 mL | Freq: Once | OROMUCOSAL | Status: AC
  Administered 2019-10-23: 15 mL via OROMUCOSAL
  Filled 2019-10-23: qty 15

## 2019-10-23 NOTE — ED Triage Notes (Signed)
Pt comes POV with sore throat on left side for 2 days. Pt reports trouble swallowing and fatigue. Denies any fevers.

## 2019-10-23 NOTE — Discharge Instructions (Addendum)
Follow discharge care instruction take medication directed.  Advised extra strength Tylenol for fever and body aches.

## 2019-10-23 NOTE — ED Provider Notes (Signed)
[facility] Emergency Department Provider Note   ____________________________________________   First MD Initiated Contact with Patient 10/23/19 1325     (approximate)  I have reviewed the triage vital signs and the nursing notes.   HISTORY  Chief Complaint Sore Throat    HPI Linda Thornton is a 29 y.o. female patient presents with sore throat for 2 days.  Patient states difficulty swallowing but can tolerate fluids and soft foods.  Patient also complained of fatigue.  Patient unsure of fever.  Patient denies recent travel or known contact with COVID-19.  Patient rates the pain as a 9/10.  Patient described pain as "sore".  No palliative measures for complaint.         Past Medical History:  Diagnosis Date  . Anxiety   . Bipolar 1 disorder (HCC)   . Bulging lumbar disc   . Bursitis   . Migraine headache   . Renal disorder    kidney stone  . Substance abuse (HCC)    cocaine, clonazapam, hydrocodone, oxycodone  . Trichomonal vulvovaginitis 04/2013    Patient Active Problem List   Diagnosis Date Noted  . Tobacco abuse 04/27/2016  . Overweight (BMI 25.0-29.9) 04/27/2016  . Previous cesarean section 09/11/2014  . 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 Adjustment disorder with anxiety 11/15/2013  . Fibromyalgia 09/09/2011    Past Surgical History:  Procedure Laterality Date  . CESAREAN SECTION      Prior to Admission medications   Medication Sig Start Date End Date Taking? Authorizing Provider  etonogestrel-ethinyl estradiol (NUVARING) 0.12-0.015 MG/24HR vaginal ring INSERT 1 RING VAGINALLY FOR 3 WEEKS THEN REMOVE FOR 1 WEEK Patient not taking: Reported on 09/30/2019 07/07/18   Copland, Ilona Sorrel, PA-C  fluconazole (DIFLUCAN) 150 MG tablet Take one tablet every 3 days x 2 doses Patient not taking: Reported on 09/30/2019 11/05/18   Farrel Conners, CNM  lidocaine (XYLOCAINE) 2 %  solution Use as directed 5 mLs in the mouth or throat every 6 (six) hours as needed for mouth pain. For swish and swallow. 10/23/19   Joni Reining, PA-C  methylPREDNISolone (MEDROL DOSEPAK) 4 MG TBPK tablet Take Tapered dose as directed starting tomorrow morning 10/23/19   Joni Reining, PA-C    Allergies Hydrocodone-acetaminophen and Latex  Family History  Problem Relation Age of Onset  . Irregular heart beat Mother   . Hypertension Mother   . Migraines Mother   . Rheum arthritis Maternal Grandmother   . Osteoarthritis Maternal Grandmother   . Diabetes Maternal Grandfather   . Rheum arthritis Maternal Grandfather   . Osteoarthritis Maternal Grandfather   . Heart disease Maternal Grandfather   . Other Brother 30       drug overdose  . Hyperlipidemia Father   . Hypertension Father   . Polycystic ovary syndrome Sister   . Diabetes Maternal Aunt     Social History Social History   Tobacco Use  . Smoking status: Current Every Day Smoker    Packs/day: 0.25    Types: Cigarettes  . Smokeless tobacco: Never Used  Substance Use Topics  . Alcohol use: No  . Drug use: No    Comment: Hx of substance abuse: cocaine, benzodiazapines, oxycodone, hydrocodone    Review of Systems  Constitutional: No fever/chills Eyes: No visual changes. ENT: Sore throat.   Cardiovascular: Denies chest pain. Respiratory: Denies shortness of breath. Gastrointestinal: No abdominal pain.  No nausea, no vomiting.  No diarrhea.  No constipation. Genitourinary: Negative for dysuria. Musculoskeletal: Negative for back pain. Skin: Negative for rash. Neurological: Negative for headaches, focal weakness or numbness.   ____________________________________________   PHYSICAL EXAM:  VITAL SIGNS: ED Triage Vitals  Enc Vitals Group     BP 10/23/19 1320 122/75     Pulse Rate 10/23/19 1320 96     Resp 10/23/19 1320 18     Temp 10/23/19 1320 100.1 F (37.8 C)     Temp Source 10/23/19 1320 Oral      SpO2 10/23/19 1320 97 %     Weight 10/23/19 1318 140 lb (63.5 kg)     Height 10/23/19 1318 5\' 2"  (1.575 m)     Head Circumference --      Peak Flow --      Pain Score 10/23/19 1317 9     Pain Loc --      Pain Edu? --      Excl. in GC? --    Constitutional: Alert and oriented. Well appearing and in no acute distress. Mouth/Throat: Mucous membranes are moist.  Oropharynx non-erythematous.  Nonedematous or exudative tonsils. Neck: No stridor.   Hematological/Lymphatic/Immunilogical: No cervical lymphadenopathy. Cardiovascular: Normal rate, regular rhythm. Grossly normal heart sounds.  Good peripheral circulation. Respiratory: Normal respiratory effort.  No retractions. Lungs CTAB. Gastrointestinal: Soft and nontender. No distention. No abdominal bruits. No CVA tenderness. Genitourinary: Deferred ___________________________________________   LABS (all labs ordered are listed, but only abnormal results are displayed)  Labs Reviewed  GROUP A STREP BY PCR  SARS CORONAVIRUS 2 BY RT PCR (HOSPITAL ORDER, PERFORMED IN [facility])   ____________________________________________  EKG   ____________________________________________  RADIOLOGY  ED MD interpretation:    Official radiology report(s): No results found.  ____________________________________________   PROCEDURES  Procedure(s) performed (including Critical Care):  Procedures   ____________________________________________   INITIAL IMPRESSION / ASSESSMENT AND PLAN / ED COURSE  As part of my medical decision making, I reviewed the following data within the electronic MEDICAL RECORD NUMBER     Patient presents with 2 days of sore throat and fatigue.  Discussed negative strep and Covid results with patient.  Patient physical exam and complaint consistent with viral pharyngitis.  Patient given discharge care instruction advised take medication as directed.  Patient advised follow-up open-door clinic condition  persist.    Linda Thornton was evaluated in Emergency Department on 10/23/2019 for the symptoms described in the history of present illness. She was evaluated in the context of the global COVID-19 pandemic, which necessitated consideration that the patient might be at risk for infection with the SARS-CoV-2 virus that causes COVID-19. Institutional protocols and algorithms that pertain to the evaluation of patients at risk for COVID-19 are in a state of rapid change based on information released by regulatory bodies including the CDC and federal and state organizations. These policies and algorithms were followed during the patient's care in the ED.       ____________________________________________   FINAL CLINICAL IMPRESSION(S) / ED DIAGNOSES  Final diagnoses:  Viral pharyngitis     ED Discharge Orders         Ordered    methylPREDNISolone (MEDROL DOSEPAK) 4 MG TBPK tablet     10/23/19 1511    lidocaine (XYLOCAINE) 2 % solution  Every 6 hours PRN     10/23/19 1511           Note:  This document was prepared using Dragon voice recognition software and may  include unintentional dictation errors.    Sable Feil, PA-C 10/23/19 1519    Vanessa Wheatland, MD 10/24/19 [phone]

## 2019-10-28 NOTE — Telephone Encounter (Signed)
Patient was informed.

## 2019-11-05 ENCOUNTER — Encounter: Payer: Medicaid Other | Admitting: Certified Nurse Midwife

## 2019-11-05 NOTE — Progress Notes (Deleted)
PT is present today for confirmation of pregnancy. Pt LMP unknown pt stated maybe 09/13/2019. UPT done today results were . Pt stated that

## 2019-11-15 ENCOUNTER — Encounter: Payer: Medicaid Other | Admitting: Certified Nurse Midwife

## 2019-12-20 ENCOUNTER — Other Ambulatory Visit: Payer: Self-pay

## 2019-12-20 ENCOUNTER — Encounter: Payer: Self-pay | Admitting: Certified Nurse Midwife

## 2019-12-20 ENCOUNTER — Ambulatory Visit (INDEPENDENT_AMBULATORY_CARE_PROVIDER_SITE_OTHER): Payer: Medicaid Other | Admitting: Certified Nurse Midwife

## 2019-12-20 ENCOUNTER — Other Ambulatory Visit (HOSPITAL_COMMUNITY)
Admission: RE | Admit: 2019-12-20 | Discharge: 2019-12-20 | Disposition: A | Discharge status: Home / Self Care | Payer: Medicaid Other | Source: Ambulatory Visit | Attending: Certified Nurse Midwife | Admitting: Certified Nurse Midwife

## 2019-12-20 VITALS — BP 120/81 | HR 94 | Ht 62.0 in | Wt 149.6 lb

## 2019-12-20 DIAGNOSIS — N923 Ovulation bleeding: Secondary | ICD-10-CM | POA: Diagnosis not present

## 2019-12-20 DIAGNOSIS — Z8742 Personal history of other diseases of the female genital tract: Secondary | ICD-10-CM | POA: Insufficient documentation

## 2019-12-20 DIAGNOSIS — N898 Other specified noninflammatory disorders of vagina: Secondary | ICD-10-CM

## 2019-12-20 MED ORDER — METRONIDAZOLE 0.75 % VA GEL
1.0000 | Freq: Every day | VAGINAL | 5 refills | Status: DC
Start: 2019-12-20 — End: 2020-06-30

## 2019-12-20 NOTE — Progress Notes (Signed)
Pt present for possible bv. Pt stated having vaginal discharge with odor x 1 week. Pt requested a blood pregnancy test.

## 2019-12-20 NOTE — Patient Instructions (Signed)
Metronidazole vaginal gel What is this medicine? METRONIDAZOLE (me troe NI da zole) VAGINAL GEL is an antiinfective. It is used to treat bacterial vaginitis. This medicine may be used for other purposes; ask your health care provider or pharmacist if you have questions. COMMON BRAND NAME(S): MetroGel, MetroGel Vaginal, MetroGel-Vaginal, NUVESSA, Vandazole What should I tell my health care provider before I take this medicine? They need to know if you have any of these conditions:  Cockayne syndrome  history of blood diseases, like sickle cell disease or leukemia  history of yeast infection  if you often drink alcohol  liver disease  an unusual or allergic reaction to metronidazole, nitroimidazoles, parabens, or other medicines, foods, dyes, or preservatives  pregnant or trying to get pregnant  breast-feeding How should I use this medicine? This medicine is only for use in the vagina. Do not take by mouth or apply to other areas of the body. Follow the directions on the prescription label. Wash hands before and after use. Screw the applicator to the tube and squeeze the tube gently to fill the applicator. Lie on your back, part and bend your knees. Insert the applicator tip high in the vagina and push the plunger to release the gel into the vagina. Gently remove the applicator. Wash the applicator well with warm water and soap. Use at regular intervals. Finish the full course prescribed by your doctor or health care professional even if you think your condition is better. Do not stop using except on the advice of your doctor or health care professional. Talk to your pediatrician regarding the use of this medicine in children. While this drug maybe prescribed for children as young as 12 years for selected conditions, precautions do apply. Overdosage: If you think you have taken too much of this medicine contact a poison control center or emergency room at once. NOTE: This medicine is only  for you. Do not share this medicine with others. What if I miss a dose? If you miss a dose, use it as soon as you can. If it is almost time for your next dose, use only that dose. Do not use double or extra doses. What may interact with this medicine? Do not take this medicine with any of the following medications:  alcohol or any product that contains alcohol  cisapride  disulfiram  dronedarone  pimozide  thioridazine This medicine may also interact with the following medications:  amiodarone  birth control pills  busulfan  carbamazepine  cimetidine  cyclosporine  fluorouracil  lithium  other medicines that prolong the QT interval (cause an abnormal heart rhythm) like dofetilide, ziprasidone  phenobarbital  phenytoin  quinidine  tacrolimus  vecuronium  warfarin This list may not describe all possible interactions. Give your health care provider a list of all the medicines, herbs, non-prescription drugs, or dietary supplements you use. Also tell them if you smoke, drink alcohol, or use illegal drugs. Some items may interact with your medicine. What should I watch for while using this medicine? Tell your doctor or health care professional if your symptoms do not improve or if they get worse. You may get drowsy or dizzy. Do not drive, use machinery, or do anything that needs mental alertness until you know how this medicine affects you. Do not stand or sit up quickly, especially if you are an older patient. This reduces the risk of dizzy or fainting spells. Ask your doctor or health care professional if you should avoid alcohol. Many nonprescription cough and   cold products contain alcohol. Metronidazole can cause an unpleasant reaction when taken with alcohol. The reaction includes flushing, headache, nausea, vomiting, sweating, and increased thirst. The reaction can last from 30 minutes to several hours. If you are being treated for a sexually transmitted disease,  avoid sexual contact until you have finished your treatment. Your sexual partner may also need treatment. What side effects may I notice from receiving this medicine? Side effects that you should report to your doctor or health care professional as soon as possible:  allergic reactions like skin rash, itching or hives, swelling of the face, lips, or tongue  confusion  fast, irregular heartbeat  fever, chills, sore throat  fever with rash, swollen lymph nodes, or swelling of the face  pain, tingling, numbness in the hands or feet  redness, blistering, peeling or loosening of the skin, including inside the mouth  seizures  sign and symptoms of liver injury like dark yellow or brown urine; general ill feeling or flu-like symptoms; light colored stools; loss of appetite; nausea; right upper belly pain; unusually weak or tired; yellowing of the eyes or skin  vaginal discharge, itching, or odor in women Side effects that usually do not require medical attention (report to your doctor or health care professional if they continue or are bothersome):  changes in taste  diarrhea  headache  nausea, vomiting  stomach pain This list may not describe all possible side effects. Call your doctor for medical advice about side effects. You may report side effects to FDA at 1-800-FDA-1088. Where should I keep my medicine? Keep out of the reach of children. Store at room temperature between 15 and 30 degrees C (59 and 86 degrees F). Do not freeze. Throw away any unused medicine after the expiration date. NOTE: This sheet is a summary. It may not cover all possible information. If you have questions about this medicine, talk to your doctor, pharmacist, or health care provider.  2020 Elsevier/Gold Standard (2018-05-19 06:53:27)  

## 2019-12-20 NOTE — Progress Notes (Signed)
GYN ENCOUNTER NOTE  Subjective:       Linda Thornton is a 29 y.o. G2P3003 female is here for gynecologic evaluation of the following issues:  1. Intermenstrual spotting 2. Vaginal odor with history of frequent bacterial vaginosis  Notes "weird" period and vaginal odor after intercourse. Recently completed treatment for BV. Requests blood pregnancy test.   Denies difficulty breathing or respiratory distress, chest pain, abdominal pain, excessive vaginal bleeding, dysuria, and leg pain or swelling.    Gynecologic History  Patient's last menstrual period was 12/13/2019.   Upstream - 12/20/19 1144      Pregnancy Intention Screening   Does the patient want to become pregnant in the next year? No    Does the patient's partner want to become pregnant in the next year? No    Would the patient like to discuss contraceptive options today? No      Contraception Wrap Up   Current Method Vasectomy    Contraception Counseling Provided Yes          The pregnancy intention screening data noted above was reviewed. Potential methods of contraception were discussed. The patient elected to proceed with Vasectomy.   Last Pap: 06/2018. Results were: normal  Obstetric History  OB History  Gravida Para Term Preterm AB Living  3 3 3     3   SAB TAB Ectopic Multiple Live Births          3    # Outcome Date GA Lbr Len/2nd Weight Sex Delivery Anes PTL Lv  3 Term 10/31/16   8 lb (3.629 kg) M CS-Unspec  N LIV  2 Term 10/12/14 [redacted]w[redacted]d  7 lb 6 oz (3.345 kg) M CS-Unspec  N LIV  1 Term 05/26/09 [redacted]w[redacted]d  7 lb 6 oz (3.345 kg) M CS-LTranv  N LIV     Complications: Fetal Intolerance, Thick meconium stained amniotic fluid    Obstetric Comments  G1 - C-section for FITL/ FTP (only dilated 1.5 cm, failed IOL).   G2- performed for repeat only    Past Medical History:  Diagnosis Date  . Anxiety   . Bipolar 1 disorder (HCC)   . Bulging lumbar disc   . Bursitis   . Migraine headache   . Renal disorder     kidney stone  . Substance abuse (HCC)    cocaine, clonazapam, hydrocodone, oxycodone  . Trichomonal vulvovaginitis 04/2013    Past Surgical History:  Procedure Laterality Date  . CESAREAN SECTION      Allergies  Allergen Reactions  . Hydrocodone-Acetaminophen Itching  . Latex Rash    Social History   Socioeconomic History  . Marital status: Single    Spouse name: Not on file  . Number of children: 3  . Years of education: Not on file  . Highest education level: Not on file  Occupational History  . Not on file  Tobacco Use  . Smoking status: Current Every Day Smoker    Packs/day: 0.25    Types: Cigarettes  . Smokeless tobacco: Never Used  Vaping Use  . Vaping Use: Never used  Substance and Sexual Activity  . Alcohol use: No  . Drug use: No    Comment: Hx of substance abuse: cocaine, benzodiazapines, oxycodone, hydrocodone  . Sexual activity: Yes    Partners: Male    Birth control/protection: None    Comment: vastectomy  Other Topics Concern  . Not on file  Social History Narrative  . Not on file   Social Determinants  of Health   Financial Resource Strain:   . Difficulty of Paying Living Expenses:   Food Insecurity:   . Worried About Programme researcher, broadcasting/film/video in the Last Year:   . Barista in the Last Year:   Transportation Needs:   . Freight forwarder (Medical):   Marland Kitchen Lack of Transportation (Non-Medical):   Physical Activity:   . Days of Exercise per Week:   . Minutes of Exercise per Session:   Stress:   . Feeling of Stress :   Social Connections:   . Frequency of Communication with Friends and Family:   . Frequency of Social Gatherings with Friends and Family:   . Attends Religious Services:   . Active Member of Clubs or Organizations:   . Attends Banker Meetings:   Marland Kitchen Marital Status:   Intimate Partner Violence:   . Fear of Current or Ex-Partner:   . Emotionally Abused:   Marland Kitchen Physically Abused:   . Sexually Abused:     Family  History  Problem Relation Age of Onset  . Irregular heart beat Mother   . Hypertension Mother   . Migraines Mother   . Rheum arthritis Maternal Grandmother   . Osteoarthritis Maternal Grandmother   . Diabetes Maternal Grandfather   . Rheum arthritis Maternal Grandfather   . Osteoarthritis Maternal Grandfather   . Heart disease Maternal Grandfather   . Other Brother 30       drug overdose  . Hyperlipidemia Father   . Hypertension Father   . Polycystic ovary syndrome Sister   . Diabetes Maternal Aunt     The following portions of the patient's history were reviewed and updated as appropriate: allergies, current medications, past family history, past medical history, past social history, past surgical history and problem list.  Review of Systems  ROS negative except as noted above. Information obtained from patient.   Objective:   BP 120/81   Pulse 94   Ht 5\' 2"  (1.575 m)   Wt 149 lb 9.6 oz (67.9 kg)   LMP 12/13/2019   BMI 27.36 kg/m   CONSTITUTIONAL: Well-developed, well-nourished female in no acute distress.   PELVIC:  External Genitalia: Normal  Vagina: Swab collected  MUSCULOSKELETAL: Normal range of motion. No tenderness.  No cyanosis, clubbing, or edema.  Assessment:   1. Intermenstrual spotting  - Cervicovaginal ancillary only - Beta hCG quant (ref lab)  2. History of vaginitis  - Cervicovaginal ancillary only  3. Vaginal odor  - Cervicovaginal ancillary only   Plan:   Vaginal swab collected, will contact patient with results.   Beta today, see chart.   Rx Metrogel, see orders.   Reviewed red flag symptoms and when to call.   RTC if symptoms worsen or fail to improve.    02/13/2020, CNM [facility], [facility] 12/20/19 12:04 PM

## 2019-12-21 LAB — BETA HCG QUANT (REF LAB): hCG Quant: <1 m[IU]/mL

## 2019-12-22 LAB — CERVICOVAGINAL ANCILLARY ONLY
Bacterial Vaginitis (gardnerella): POSITIVE — AB
Candida Glabrata: NEGATIVE
Candida Vaginitis: NEGATIVE
Comment: NEGATIVE
Comment: NEGATIVE
Comment: NEGATIVE
Comment: NEGATIVE
Trichomonas: NEGATIVE

## 2020-02-29 ENCOUNTER — Telehealth: Payer: Self-pay | Admitting: Certified Nurse Midwife

## 2020-02-29 NOTE — Telephone Encounter (Signed)
Called pt in regards to message. Informed pt she had 5 refills for the metrogel. Pt states she was unaware and thought the 1 tube was supposed to last her the 6 months. Pt states she will have it refilled with her pharmacy. Pt verbalized understanding.

## 2020-02-29 NOTE — Telephone Encounter (Signed)
Pt called in and stated that she needs refill on herMetrogel sent to [facility] on [address]. Please advise

## 2020-03-15 ENCOUNTER — Ambulatory Visit (INDEPENDENT_AMBULATORY_CARE_PROVIDER_SITE_OTHER): Payer: Medicaid Other | Admitting: Certified Nurse Midwife

## 2020-03-15 ENCOUNTER — Other Ambulatory Visit: Payer: Self-pay

## 2020-03-15 ENCOUNTER — Telehealth: Payer: Self-pay

## 2020-03-15 DIAGNOSIS — R300 Dysuria: Secondary | ICD-10-CM | POA: Diagnosis not present

## 2020-03-15 LAB — POCT URINALYSIS DIPSTICK
Bilirubin, UA: NEGATIVE
Glucose, UA: NEGATIVE
Ketones, UA: NEGATIVE
Leukocytes, UA: NEGATIVE
Nitrite, UA: NEGATIVE
Protein, UA: NEGATIVE
Spec Grav, UA: 1.02 (ref 1.010–1.025)
Urobilinogen, UA: 0.2 E.U./dL
pH, UA: 6 (ref 5.0–8.0)

## 2020-03-15 MED ORDER — NITROFURANTOIN MONOHYD MACRO 100 MG PO CAPS
100.0000 mg | ORAL_CAPSULE | Freq: Two times a day (BID) | ORAL | 0 refills | Status: DC
Start: 2020-03-15 — End: 2020-06-30

## 2020-03-15 NOTE — Progress Notes (Signed)
Pt c/o burning, frequency, and discomfort to void. X 1 week. Denies fever. Hematuria on dip, Denies having menses today. Confirmed no allergies and preferred pharmacy. Per Doreene Burke, CNM macrobid sent to pharmacy.

## 2020-03-15 NOTE — Telephone Encounter (Signed)
Called and spoke to pt- Informed pt per Doreene Burke, CNM macrobid was sent to pharmacy on file. Informed pt that urine culture was sent and if anything comes back she would hear from [place]. Usually takes 3-4 days. Pt verbalized understanding.

## 2020-03-17 LAB — URINE CULTURE

## 2020-06-30 ENCOUNTER — Other Ambulatory Visit: Payer: Self-pay

## 2020-06-30 ENCOUNTER — Ambulatory Visit (INDEPENDENT_AMBULATORY_CARE_PROVIDER_SITE_OTHER): Payer: Medicaid Other | Admitting: Certified Nurse Midwife

## 2020-06-30 ENCOUNTER — Encounter: Payer: Self-pay | Admitting: Certified Nurse Midwife

## 2020-06-30 VITALS — BP 123/84 | HR 75 | Ht 62.0 in | Wt 157.6 lb

## 2020-06-30 DIAGNOSIS — K4090 Unilateral inguinal hernia, without obstruction or gangrene, not specified as recurrent: Secondary | ICD-10-CM

## 2020-06-30 DIAGNOSIS — K5900 Constipation, unspecified: Secondary | ICD-10-CM

## 2020-06-30 MED ORDER — POLYETHYLENE GLYCOL 3350 17 GM/SCOOP PO POWD: 1.0000 | Freq: Once | ORAL | 0 refills | Status: AC

## 2020-06-30 NOTE — Progress Notes (Signed)
Pt present for hernia pain. Pt stated having pain from her hernia since she gave birth to her last child. Pt stated she has been trying to conceive and think the hernia is the reason why.

## 2020-06-30 NOTE — Progress Notes (Signed)
I have seen, interviewed, and examined the patient in conjunction with the [employer] [facility] [employer] and affirm the diagnosis and management plan.   Gunnar Bulla, CNM [facility], [facility] 06/30/20 4:57 PM

## 2020-06-30 NOTE — Progress Notes (Signed)
Pt present for hernia pain and issues. Pt stated having the hernia since her last pregnancy. Pt c/o

## 2020-06-30 NOTE — Progress Notes (Signed)
GYN ENCOUNTER NOTE  Subjective:       Linda Thornton is a 30 y.o. G52P3003 female is here for evaluation of the following issues:  1. Suspected hernia reports intermittent lower abdominal and  pelvic pain with lifting or bending over during work and activities of daily living 2. Constipation-up to five (5) days between bowel movements  Denies difficulty breathing, respiratory difficulty, chest pain, excessive vaginal bleeding, and leg swelling or pain.  Gynecologic History  Patient's last menstrual period was 06/09/2020.   Contraception: Natural Family Planning  Last Pap:07/07/2018. Results were: Neg  Obstetric History  OB History  Gravida Para Term Preterm AB Living  3 3 3     3   SAB IAB Ectopic Multiple Live Births          3    # Outcome Date GA Lbr Len/2nd Weight Sex Delivery Anes PTL Lv  3 Term 10/31/16   3629 g M CS-Unspec  N LIV  2 Term 10/12/14 [redacted]w[redacted]d  3345 g M CS-Unspec  N LIV  1 Term 05/26/09 [redacted]w[redacted]d  3345 g M CS-LTranv  N LIV     Complications: Fetal Intolerance, Thick meconium stained amniotic fluid    Obstetric Comments  G1 - C-section for FITL/ FTP (only dilated 1.5 cm, failed IOL).   G2- performed for repeat only    Past Medical History:  Diagnosis Date  . Anxiety   . Bipolar 1 disorder (HCC)   . Bulging lumbar disc   . Bursitis   . Migraine headache   . Renal disorder    kidney stone  . Substance abuse (HCC)    cocaine, clonazapam, hydrocodone, oxycodone  . Trichomonal vulvovaginitis 04/2013    Past Surgical History:  Procedure Laterality Date  . CESAREAN SECTION      Current Outpatient Medications on File Prior to Visit  Medication Sig Dispense Refill  . buprenorphine-naloxone (SUBOXONE) 8-2 mg SUBL SL tablet Place 1 tablet under the tongue daily.     No current facility-administered medications on file prior to visit.    Allergies  Allergen Reactions  . Hydrocodone-Acetaminophen Itching  . Latex Rash    Social History   Socioeconomic  History  . Marital status: Single    Spouse name: Not on file  . Number of children: 3  . Years of education: Not on file  . Highest education level: Not on file  Occupational History  . Not on file  Tobacco Use  . Smoking status: Current Every Day Smoker    Packs/day: 0.25    Types: Cigarettes  . Smokeless tobacco: Never Used  Vaping Use  . Vaping Use: Never used  Substance and Sexual Activity  . Alcohol use: No  . Drug use: No    Comment: Hx of substance abuse: cocaine, benzodiazapines, oxycodone, hydrocodone  . Sexual activity: Yes    Partners: Male  Other Topics Concern  . Not on file  Social History Narrative  . Not on file   Social Determinants of Health   Financial Resource Strain: Not on file  Food Insecurity: Not on file  Transportation Needs: Not on file  Physical Activity: Not on file  Stress: Not on file  Social Connections: Not on file  Intimate Partner Violence: Not on file    Family History  Problem Relation Age of Onset  . Irregular heart beat Mother   . Hypertension Mother   . Migraines Mother   . Rheum arthritis Maternal Grandmother   . Osteoarthritis Maternal Grandmother   .  Diabetes Maternal Grandfather   . Rheum arthritis Maternal Grandfather   . Osteoarthritis Maternal Grandfather   . Heart disease Maternal Grandfather   . Other Brother 30       drug overdose  . Hyperlipidemia Father   . Hypertension Father   . Polycystic ovary syndrome Sister   . Diabetes Maternal Aunt     The following portions of the patient's history were reviewed and updated as appropriate: allergies, current medications, past family history, past medical history, past social history, past surgical history and problem list.  Review of Systems  ROS- Negative other than what was reported above. Information obtained verbally from patient.  Objective:   BP 123/84   Pulse 75   Ht 5\' 2"  (1.575 m)   Wt 71.5 kg   LMP 06/09/2020   BMI 28.83 kg/m     CONSTITUTIONAL: Well-developed, well-nourished female in no acute distress.   ABDOMEN: Soft, non distended. Left lower abdominal tenderness with palpation. Small, soft round mass felt in left inguinal area, reducible with gentle pressure.   MUSCULOSKELETAL: Normal range of motion. No tenderness.    Assessment:   1. Left inguinal hernia - Ambulatory referral to General Surgery    Plan:   Referral to general surgery, see orders.  Rx Miralax, see orders.   Reviewed red flag symptoms and when to call.   RTC for previously scheduled annual appointment, or sooner if needed.  06/11/2020, Student-MidWife [employer] 06/30/20 4:40 PM

## 2020-06-30 NOTE — Patient Instructions (Addendum)
Constipation, Adult Constipation is when a person has trouble pooping (having a bowel movement). When you have this condition, you may poop fewer than 3 times a week. Your poop (stool) may also be dry, hard, or bigger than normal. Follow these instructions at home: Eating and drinking  Eat foods that have a lot of fiber, such as: ? Fresh fruits and vegetables. ? Whole grains. ? Beans.  Eat less of foods that are low in fiber and high in fat and sugar, such as: ? [place] fries. ? Hamburgers. ? Cookies. ? Candy. ? Soda.  Drink enough fluid to keep your pee (urine) pale yellow.   General instructions  Exercise regularly or as told by your doctor. Try to do 150 minutes of exercise each week.  Go to the restroom when you feel like you need to poop. Do not hold it in.  Take over-the-counter and prescription medicines only as told by your doctor. These include any fiber supplements.  When you poop: ? Do deep breathing while relaxing your lower belly (abdomen). ? Relax your pelvic floor. The pelvic floor is a group of muscles that support the rectum, bladder, and intestines (as well as the uterus in women).  Watch your condition for any changes. Tell your doctor if you notice any.  Keep all follow-up visits as told by your doctor. This is important. Contact a doctor if:  You have pain that gets worse.  You have a fever.  You have not pooped for 4 days.  You vomit.  You are not hungry.  You lose weight.  You are bleeding from the opening of the butt (anus).  You have thin, pencil-like poop. Get help right away if:  You have a fever, and your symptoms suddenly get worse.  You leak poop or have blood in your poop.  Your belly feels hard or bigger than normal (bloated).  You have very bad belly pain.  You feel dizzy or you faint. Summary  Constipation is when a person poops fewer than 3 times a week, has trouble pooping, or has poop that is dry, hard, or bigger than  normal.  Eat foods that have a lot of fiber.  Drink enough fluid to keep your pee (urine) pale yellow.  Take over-the-counter and prescription medicines only as told by your doctor. These include any fiber supplements. This information is not intended to replace advice given to you by your health care provider. Make sure you discuss any questions you have with your health care provider. Document Revised: 04/14/2019 Document Reviewed: 04/14/2019 Elsevier Patient Education  2021 Elsevier Inc.   Hernia, Adult     A hernia happens when tissue inside your body pushes out through a weak spot in your belly muscles (abdominal wall). This makes a round lump (bulge). The lump may be:  In a scar from surgery that was done in your belly (incisional hernia).  Near your belly button (umbilical hernia).  In your groin (inguinal hernia). Your groin is the area where your leg meets your lower belly (abdomen). This kind of hernia could also be: ? In your scrotum, if you are female. ? In folds of skin around your vagina, if you are female.  In your upper thigh (femoral hernia).  Inside your belly (hiatal hernia). This happens when your stomach slides above the muscle between your belly and your chest (diaphragm). If your hernia is small and it does not cause pain, you may not need treatment. If your hernia is large  or it causes pain, you may need surgery. Follow these instructions at home: Activity  Avoid stretching or overusing (straining) the muscles near your hernia. Straining can happen when you: ? Lift something heavy. ? Poop (have a bowel movement).  Do not lift anything that is heavier than 10 lb (4.5 kg), or the limit that you are told, until your doctor says that it is safe.  Use the strength of your legs when you lift something heavy. Do not use only your back muscles to lift. General instructions  Do these things if told by your doctor so you do not have trouble pooping  (constipation): ? Drink enough fluid to keep your pee (urine) pale yellow. ? Eat foods that are high in fiber. These include fresh fruits and vegetables, whole grains, and beans. ? Limit foods that are high in fat and processed sugars. These include foods that are fried or sweet. ? Take medicine for trouble pooping.  When you cough, try to cough gently.  You may try to push your hernia in by very gently pressing on it when you are lying down. Do not try to force the bulge back in if it will not push in easily.  If you are overweight, work with your doctor to lose weight safely.  Do not use any products that have nicotine or tobacco in them. These include cigarettes and e-cigarettes. If you need help quitting, ask your doctor.  If you will be having surgery (hernia repair), watch your hernia for changes in shape, size, or color. Tell your doctor if you see any changes.  Take over-the-counter and prescription medicines only as told by your doctor.  Keep all follow-up visits as told by your doctor. Contact a doctor if:  You get new pain, swelling, or redness near your hernia.  You poop fewer times in a week than normal.  You have trouble pooping.  You have poop (stool) that is more dry than normal.  You have poop that is harder or larger than normal. Get help right away if:  You have a fever.  You have belly pain that gets worse.  You feel sick to your stomach (nauseous).  You throw up (vomit).  Your hernia cannot be pushed in by very gently pressing on it when you are lying down. Do not try to force the bulge back in if it will not push in easily.  Your hernia: ? Changes in shape or size. ? Changes color. ? Feels hard or it hurts when you touch it. These symptoms may represent a serious problem that is an emergency. Do not wait to see if the symptoms will go away. Get medical help right away. Call your local emergency services (911 in the [place] Summary  A hernia  happens when tissue inside your body pushes out through a weak spot in the belly muscles. This creates a bulge.  If your hernia is small and it does not hurt, you may not need treatment. If your hernia is large or it hurts, you may need surgery.  If you will be having surgery, watch your hernia for changes in shape, size, or color. Tell your doctor about any changes. This information is not intended to replace advice given to you by your health care provider. Make sure you discuss any questions you have with your health care provider. Document Revised: 09/17/2018 Document Reviewed: 02/26/2017 Elsevier Patient Education  2021 [employer].

## 2020-07-03 ENCOUNTER — Encounter: Payer: Medicaid Other | Admitting: Certified Nurse Midwife

## 2020-07-03 NOTE — Patient Instructions (Incomplete)

## 2020-07-03 NOTE — Progress Notes (Deleted)
Pt present for annual exam.  

## 2020-08-01 ENCOUNTER — Other Ambulatory Visit: Payer: Self-pay

## 2020-08-01 ENCOUNTER — Ambulatory Visit: Payer: Medicaid Other | Admitting: Surgery

## 2020-08-01 ENCOUNTER — Encounter: Payer: Self-pay | Admitting: Surgery

## 2020-08-01 VITALS — BP 116/78 | HR 78 | Temp 97.8°F | Ht 63.0 in | Wt 155.0 lb

## 2020-08-01 DIAGNOSIS — R1032 Left lower quadrant pain: Secondary | ICD-10-CM

## 2020-08-01 NOTE — Progress Notes (Signed)
Patient ID: [name], female   DOB: Sep 21, 1990, 30 y.o.   MRN: [id number]  Chief Complaint: Left lower quadrant pain  History of Present Illness Linda Thornton is a 30 y.o. female with 1 to 2-year history of left lower quadrant pain, exacerbated by bending.  Sometimes worsen with the cough.  No reproducible bulging known or seen.  Does not appear to be groin related.  She has had prior cesarean sections.  She appears to have significant constipation and while she has bearing-down straining to have a bowel movement often it is made worse.  She has been previously recommended to have MiraLAX but is never taken it.  Past Medical History Past Medical History:  Diagnosis Date  . Anxiety   . Bipolar 1 disorder (HCC)   . Bulging lumbar disc   . Bursitis   . Migraine headache   . Renal disorder    kidney stone  . Substance abuse (HCC)    cocaine, clonazapam, hydrocodone, oxycodone  . Trichomonal vulvovaginitis 04/2013      Past Surgical History:  Procedure Laterality Date  . CESAREAN SECTION      Allergies  Allergen Reactions  . Hydrocodone-Acetaminophen Itching  . Latex Rash    Current Outpatient Medications  Medication Sig Dispense Refill  . buprenorphine-naloxone (SUBOXONE) 8-2 mg SUBL SL tablet Place 1 tablet under the tongue daily.     No current facility-administered medications for this visit.    Family History Family History  Problem Relation Age of Onset  . Irregular heart beat Mother   . Hypertension Mother   . Migraines Mother   . Rheum arthritis Maternal Grandmother   . Osteoarthritis Maternal Grandmother   . Diabetes Maternal Grandfather   . Rheum arthritis Maternal Grandfather   . Osteoarthritis Maternal Grandfather   . Heart disease Maternal Grandfather   . Other Brother 30       drug overdose  . Hyperlipidemia Father   . Hypertension Father   . Polycystic ovary syndrome Sister   . Diabetes Maternal Aunt       Social History Social History    Tobacco Use  . Smoking status: Current Every Day Smoker    Packs/day: 0.25    Types: Cigarettes  . Smokeless tobacco: Never Used  Vaping Use  . Vaping Use: Never used  Substance Use Topics  . Alcohol use: No  . Drug use: No    Comment: Hx of substance abuse: cocaine, benzodiazapines, oxycodone, hydrocodone        Review of Systems  Constitutional: Negative.   HENT: Negative.   Eyes: Negative.   Respiratory: Negative.   Cardiovascular: Negative.   Gastrointestinal: Positive for abdominal pain, constipation, heartburn and nausea.  Genitourinary: Negative.   Skin: Negative.   Neurological: Positive for headaches.  Psychiatric/Behavioral: Negative.       Physical Exam Blood pressure 116/78, pulse 78, temperature 97.8 F (36.6 C), temperature source Oral, height 5\' 3"  (1.6 m), weight 155 lb (70.3 kg), last menstrual period 07/12/2020, SpO2 96 %. Last Weight  Most recent update: 08/01/2020  1:59 PM   Weight  70.3 kg (155 lb)            CONSTITUTIONAL: Well developed, and nourished, appropriately responsive and aware without distress.   EYES: Sclera non-icteric.   EARS, NOSE, MOUTH AND THROAT: Mask worn.  Hearing is intact to voice.  NECK: Trachea is midline, and there is no jugular venous distension.  LYMPH NODES:  Lymph nodes in the neck  are not enlarged. RESPIRATORY:  Lungs are clear, and breath sounds are equal bilaterally. Normal respiratory effort without pathologic use of accessory muscles. CARDIOVASCULAR: Heart is regular in rate and rhythm. GI: The abdomen is soft, nontender, and nondistended. There were no palpable masses. I did not appreciate hepatosplenomegaly. There were normal bowel sounds.  No change with Valsalva, or lifting of the head/crunch maneuver. GU: No obvious left groin bulge.  No palpable pain or tenderness in the left groin.  Linda Thornton present as chaperone, underwear never removed. MUSCULOSKELETAL:  Symmetrical muscle tone appreciated in all four  extremities.    SKIN: Skin turgor is normal. No pathologic skin lesions appreciated.  NEUROLOGIC:  Motor and sensation appear grossly normal.  Cranial nerves are grossly without defect. PSYCH:  Alert and oriented to person, place and time. Affect is appropriate for situation.  Data Reviewed I have personally reviewed what is currently available of the patient's imaging, recent labs and medical records.   Labs:  Nothing pertinent or recent.   Imaging: Nothing pertinent or recent. Within last 24 hrs: No results found.  Assessment    Left lower quadrant pain, suspect may be secondary to constipation, possible diverticulosis.  No obvious hernia appreciated.  The area where a "bulge reduction" was noted I cannot reproduce a bulge in this area. Patient Active Problem List   Diagnosis Date Noted  . Tobacco abuse 04/27/2016  . Overweight (BMI 25.0-29.9) 04/27/2016  . Previous cesarean section 09/11/2014  . 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 Adjustment disorder with anxiety 11/15/2013  . Fibromyalgia 09/09/2011    Plan    Advised to pursue a goal of 25 to 30 g of fiber daily.  Made aware that the majority of this may be through natural sources, but advised to be aware of actual consumption and to ensure minimal consumption by daily supplementation.  Various forms of supplements discussed.  Strongly advised to consume more fluids to ensure adequate hydration, instructed to watch color of urine to determine adequacy of hydration.  Clarity is pursued in urine output, and bowel activity that correlates to significant meal intake.  Patient is to avoid deferring having bowel movements.  Subsequent utilization of MiraLAX to ensure at least daily movement, ideally twice daily bowel movements.  If multiple doses of MiraLAX are necessary utilize them.  We will obtain an abdominal/pelvic CT scan with contrast to evaluate for any underlying  diverticular disease, possible occult hernia.  We will have her follow-up after the above.   Face-to-face time spent with the patient and accompanying care providers(if present) was 30 minutes, with more than 50% of the time spent counseling, educating, and coordinating care of the patient.      Campbell Lerner M.D., FACS 08/01/2020, 2:34 PM

## 2020-08-01 NOTE — Patient Instructions (Addendum)
You can get fiber gummies over the counter, eating a high fiber diet (30 grams daily), and drink plenty of water can help with constipation. You can also try Miralax. A CT has been scheduled for 08/08/2020 @ 1 pm at [facility] in [place]. Arrive at 12:45 pm and nothing to eat or drink 4 hours prior. Please pick up contrast before your appointment at any [facility]. See follow up appointment below.    If you have any concerns or questions, please feel free to call our office.     High-Fiber Eating Plan Fiber, also called dietary fiber, is a type of carbohydrate. It is found foods such as fruits, vegetables, whole grains, and beans. A high-fiber diet can have many health benefits. Your health care provider may recommend a high-fiber diet to help:  Prevent constipation. Fiber can make your bowel movements more regular.  Lower your cholesterol.  Relieve the following conditions: ? Inflammation of veins in the anus (hemorrhoids). ? Inflammation of specific areas of the digestive tract (uncomplicated diverticulosis). ? A problem of the large intestine, also called the colon, that sometimes causes pain and diarrhea (irritable bowel syndrome, or IBS).  Prevent overeating as part of a weight-loss plan.  Prevent heart disease, type 2 diabetes, and certain cancers. What are tips for following this plan? Reading food labels  Check the nutrition facts label on food products for the amount of dietary fiber. Choose foods that have 5 grams of fiber or more per serving.  The goals for recommended daily fiber intake include: ? Men (age 77 or younger): 34-38 g. ? Men (over age 11): 28-34 g. ? Women (age 68 or younger): 25-28 g. ? Women (over age 67): 22-25 g. Your daily fiber goal is _____________ g.   Shopping  Choose whole fruits and vegetables instead of processed forms, such as apple juice or applesauce.  Choose a wide variety of high-fiber foods such as avocados, lentils,  oats, and kidney beans.  Read the nutrition facts label of the foods you choose. Be aware of foods with added fiber. These foods often have high sugar and sodium amounts per serving. Cooking  Use whole-grain flour for baking and cooking.  Cook with brown rice instead of white rice. Meal planning  Start the day with a breakfast that is high in fiber, such as a cereal that contains 5 g of fiber or more per serving.  Eat breads and cereals that are made with whole-grain flour instead of refined flour or white flour.  Eat brown rice, bulgur wheat, or millet instead of white rice.  Use beans in place of meat in soups, salads, and pasta dishes.  Be sure that half of the grains you eat each day are whole grains. General information  You can get the recommended daily intake of dietary fiber by: ? Eating a variety of fruits, vegetables, grains, nuts, and beans. ? Taking a fiber supplement if you are not able to take in enough fiber in your diet. It is better to get fiber through food than from a supplement.  Gradually increase how much fiber you consume. If you increase your intake of dietary fiber too quickly, you may have bloating, cramping, or gas.  Drink plenty of water to help you digest fiber.  Choose high-fiber snacks, such as berries, raw vegetables, nuts, and popcorn. What foods should I eat? Fruits Berries. Pears. Apples. Oranges. Avocado. Prunes and raisins. Dried figs. Vegetables Sweet potatoes. Spinach. Kale. Artichokes. Cabbage. Broccoli. Cauliflower. [place]  peas. Carrots. Squash. Grains Whole-grain breads. Multigrain cereal. Oats and oatmeal. Brown rice. Barley. Bulgur wheat. Millet. Quinoa. Bran muffins. Popcorn. Rye wafer crackers. Meats and other proteins Navy beans, kidney beans, and pinto beans. Soybeans. Split peas. Lentils. Nuts and seeds. Dairy Fiber-fortified yogurt. Beverages Fiber-fortified soy milk. Fiber-fortified orange juice. Other foods Fiber  bars. The items listed above may not be a complete list of recommended foods and beverages. Contact a dietitian for more information. What foods should I avoid? Fruits Fruit juice. Cooked, strained fruit. Vegetables Fried potatoes. Canned vegetables. Well-cooked vegetables. Grains White bread. Pasta made with refined flour. White rice. Meats and other proteins Fatty cuts of meat. Fried chicken or fried fish. Dairy Milk. Yogurt. Cream cheese. Sour cream. Fats and oils Butters. Beverages Soft drinks. Other foods Cakes and pastries. The items listed above may not be a complete list of foods and beverages to avoid. Talk with your dietitian about what choices are best for you. Summary  Fiber is a type of carbohydrate. It is found in foods such as fruits, vegetables, whole grains, and beans.  A high-fiber diet has many benefits. It can help to prevent constipation, lower blood cholesterol, aid weight loss, and reduce your risk of heart disease, diabetes, and certain cancers.  Increase your intake of fiber gradually. Increasing fiber too quickly may cause cramping, bloating, and gas. Drink plenty of water while you increase the amount of fiber you consume.  The best sources of fiber include whole fruits and vegetables, whole grains, nuts, seeds, and beans. This information is not intended to replace advice given to you by your health care provider. Make sure you discuss any questions you have with your health care provider. Document Revised: 09/30/2019 Document Reviewed: 09/30/2019 Elsevier Patient Education  2021 Elsevier Inc.     Constipation, Adult Constipation is when a person has trouble pooping (having a bowel movement). When you have this condition, you may poop fewer than 3 times a week. Your poop (stool) may also be dry, hard, or bigger than normal. Follow these instructions at home: Eating and drinking  Eat foods that have a lot of fiber, such as: ? Fresh fruits and  vegetables. ? Whole grains. ? Beans.  Eat less of foods that are low in fiber and high in fat and sugar, such as: ? [place] fries. ? Hamburgers. ? Cookies. ? Candy. ? Soda.  Drink enough fluid to keep your pee (urine) pale yellow.   General instructions  Exercise regularly or as told by your doctor. Try to do 150 minutes of exercise each week.  Go to the restroom when you feel like you need to poop. Do not hold it in.  Take over-the-counter and prescription medicines only as told by your doctor. These include any fiber supplements.  When you poop: ? Do deep breathing while relaxing your lower belly (abdomen). ? Relax your pelvic floor. The pelvic floor is a group of muscles that support the rectum, bladder, and intestines (as well as the uterus in women).  Watch your condition for any changes. Tell your doctor if you notice any.  Keep all follow-up visits as told by your doctor. This is important. Contact a doctor if:  You have pain that gets worse.  You have a fever.  You have not pooped for 4 days.  You vomit.  You are not hungry.  You lose weight.  You are bleeding from the opening of the butt (anus).  You have thin, pencil-like poop. Get help right  away if:  You have a fever, and your symptoms suddenly get worse.  You leak poop or have blood in your poop.  Your belly feels hard or bigger than normal (bloated).  You have very bad belly pain.  You feel dizzy or you faint. Summary  Constipation is when a person poops fewer than 3 times a week, has trouble pooping, or has poop that is dry, hard, or bigger than normal.  Eat foods that have a lot of fiber.  Drink enough fluid to keep your pee (urine) pale yellow.  Take over-the-counter and prescription medicines only as told by your doctor. These include any fiber supplements. This information is not intended to replace advice given to you by your health care provider. Make sure you discuss any questions  you have with your health care provider. Document Revised: 04/14/2019 Document Reviewed: 04/14/2019 Elsevier Patient Education  2021 [employer].

## 2020-08-03 ENCOUNTER — Other Ambulatory Visit: Payer: Self-pay

## 2020-08-03 ENCOUNTER — Telehealth: Payer: Self-pay | Admitting: Surgery

## 2020-08-03 DIAGNOSIS — R1032 Left lower quadrant pain: Secondary | ICD-10-CM

## 2020-08-03 NOTE — Telephone Encounter (Signed)
Angel with CT in [place] called.  Patient scheduled for CT March 1st and they need an order for a urine pregnancy test placed in Epic.  Thank you.

## 2020-08-03 NOTE — Telephone Encounter (Signed)
Pregnancy test has been ordered.

## 2020-08-08 ENCOUNTER — Ambulatory Visit
Admission: RE | Admit: 2020-08-08 | Discharge: 2020-08-08 | Disposition: A | Discharge status: Home / Self Care | Payer: Medicaid Other | Source: Ambulatory Visit | Attending: Surgery | Admitting: Surgery

## 2020-08-08 ENCOUNTER — Other Ambulatory Visit
Admission: RE | Admit: 2020-08-08 | Discharge: 2020-08-08 | Disposition: A | Discharge status: Home / Self Care | Payer: Medicaid Other | Source: Ambulatory Visit | Attending: Surgery | Admitting: Surgery

## 2020-08-08 ENCOUNTER — Other Ambulatory Visit: Payer: Self-pay

## 2020-08-08 DIAGNOSIS — R1032 Left lower quadrant pain: Secondary | ICD-10-CM | POA: Diagnosis present

## 2020-08-08 LAB — PREGNANCY, URINE: Preg Test, Ur: NEGATIVE

## 2020-08-08 MED ORDER — IOHEXOL 300 MG/ML  SOLN
100.0000 mL | Freq: Once | INTRAMUSCULAR | Status: AC | PRN
  Administered 2020-08-08: 100 mL via INTRAVENOUS

## 2020-08-15 ENCOUNTER — Encounter: Payer: Self-pay | Admitting: Surgery

## 2020-08-15 ENCOUNTER — Ambulatory Visit (INDEPENDENT_AMBULATORY_CARE_PROVIDER_SITE_OTHER): Payer: Medicaid Other | Admitting: Surgery

## 2020-08-15 ENCOUNTER — Other Ambulatory Visit: Payer: Self-pay

## 2020-08-15 VITALS — BP 118/79 | HR 70 | Temp 98.2°F | Ht 63.0 in | Wt 152.8 lb

## 2020-08-15 DIAGNOSIS — N83201 Unspecified ovarian cyst, right side: Secondary | ICD-10-CM | POA: Insufficient documentation

## 2020-08-15 DIAGNOSIS — R1032 Left lower quadrant pain: Secondary | ICD-10-CM

## 2020-08-15 NOTE — Progress Notes (Signed)
Linda Thornton returns today for review of her CT scan exam.  It appears that most of her pain is quite centrally located, and more suprapubic than anywhere else.   I was under the impression that her pain was more so left-sided, but apparently I was chasing another providers concerns on the left side.  Nevertheless, her CT scan has revealed a right sided ovarian cystic process that may be to blame, and I would like to refer her back to gynecology for further work-up/evaluation or treatment planning.  I reviewed the imaging with the patient, and this appears to make more sense with what she is feeling.  I do not believe her pains are GI in nature as she is currently utilizing fiber supplementation and laxatives to keep up.  I will be glad to see her back, there is anything further I can assist with.

## 2020-08-15 NOTE — Patient Instructions (Signed)
Referral place to [facility]. Someone from their office will call to schedule an appointment. Please call our office if you do not hear from anyone within 5 days.

## 2020-08-16 ENCOUNTER — Ambulatory Visit: Payer: Medicaid Other

## 2020-08-17 ENCOUNTER — Ambulatory Visit (INDEPENDENT_AMBULATORY_CARE_PROVIDER_SITE_OTHER): Payer: Medicaid Other | Admitting: Certified Nurse Midwife

## 2020-08-17 ENCOUNTER — Other Ambulatory Visit: Payer: Self-pay

## 2020-08-17 ENCOUNTER — Encounter: Payer: Self-pay | Admitting: Certified Nurse Midwife

## 2020-08-17 VITALS — BP 148/96 | HR 120 | Ht 63.0 in | Wt 151.0 lb

## 2020-08-17 DIAGNOSIS — N83201 Unspecified ovarian cyst, right side: Secondary | ICD-10-CM

## 2020-08-17 DIAGNOSIS — R110 Nausea: Secondary | ICD-10-CM | POA: Diagnosis not present

## 2020-08-17 NOTE — Progress Notes (Signed)
Referred back to [place] by Dr Lemar Livings.  States not a hernia but a right ovarian cyst.  Some pain for pt when bending.

## 2020-08-17 NOTE — Patient Instructions (Signed)
Ovarian Cyst  An ovarian cyst is a fluid-filled sac on an ovary. Most of these cysts go away on their own and are not cancer. Some cysts need treatment. What are the causes?  Ovarian hyperstimulation syndrome. Some medicines may lead to this problem.  Polycystic ovarian syndrome (PCOS). Problems with body chemicals (hormones) can lead to this condition.  The normal menstrual cycle. What increases the risk?  Being overweight or very overweight.  Taking medicines to increase your chance of getting pregnant.  Using some types of birth control.  Smoking. What are the signs or symptoms? Many ovarian cysts do not cause symptoms. If you get symptoms, you may have:  Pain or pressure in the area between the hip bones.  Pain in the lower belly.  Pain during sex.  Swelling in the lower belly.  Periods that are not regular.  Pain with periods. How is this treated? Many ovarian cysts go away on their own without treatment. If you need treatment, it may include:  Medicines for pain.  Fluid taken out of the cyst.  The cyst being taken out.  Birth control pills or other medicines.  Surgery to remove the ovary. Follow these instructions at home:  Take over-the-counter and prescription medicines only as told by your doctor.  Ask your doctor if you should avoid driving or using machines while you are taking your medicine.  Get exams and Pap tests as told by your doctor.  Return to your normal activities when your doctor says that it is safe.  Do not smoke or use any products that contain nicotine or tobacco. If you need help quitting, ask your doctor.  Keep all follow-up visits. Contact a doctor if:  Your periods: ? Are late. ? Are not regular. ? Stop. ? Are painful.  You have pain in the area between your hip bones, and the pain does not go away.  You feel pressure on your bladder.  You have trouble peeing.  You feel full, or your belly hurts, swells, or  bloats.  You gain or lose weight without trying, or you are less hungry than normal.  You feel pain and pressure in your back.  You feel pain and pressure in the area between your hip bones.  You think you may be pregnant. Get help right away if:  You have pain in your belly that is very bad or gets worse.  You have pain in the area between your hip bones, and the pain is very bad or gets worse.  You cannot eat or drink without vomiting.  You get a fever or chills all of a sudden.  Your period is a lot heavier than usual. Summary  An ovarian cyst is a fluid-filled sac on an ovary.  Some cysts may cause problems and need treatment.  Most of these cysts go away on their own. This information is not intended to replace advice given to you by your health care provider. Make sure you discuss any questions you have with your health care provider. Document Revised: 11/04/2019 Document Reviewed: 11/04/2019 Elsevier Patient Education  2021 [employer]vier Inc.  

## 2020-08-17 NOTE — Progress Notes (Signed)
GYN ENCOUNTER NOTE  Subjective:       Linda Thornton is a 30 y.o. [phone] female is here for gynecologic evaluation of the following issues:  1. Right ovarian cyst, told to come back to [place].  2. Mild Nausea, no vomiting  Denies difficulty breathing, respiratory distress, chest pain, and leg pain or swelling.   Gynecologic History  Patient's last menstrual period was 08/12/2020 (exact date).  Period Cycle (Days): 28 Period Duration (Days): 3-7 Period Pattern: Regular Menstrual Flow: Moderate Menstrual Control: Maxi pad,Tampon Menstrual Control Change Freq (Hours): 3-4 hours Dysmenorrhea: (!) Mild Dysmenorrhea Symptoms: Cramping  Contraception: none   Last Pap: 07/07/2018. Results were: normal  Obstetric History OB History  Gravida Para Term Preterm AB Living  3 3 3     3   SAB IAB Ectopic Multiple Live Births          3    # Outcome Date GA Lbr Len/2nd Weight Sex Delivery Anes PTL Lv  3 Term 10/31/16   3629 g M CS-Unspec  N LIV  2 Term 10/12/14 [redacted]w[redacted]d  3345 g M CS-Unspec  N LIV  1 Term 05/26/09 [redacted]w[redacted]d  3345 g M CS-LTranv  N LIV     Complications: Fetal Intolerance, Thick meconium stained amniotic fluid    Obstetric Comments  G1 - C-section for FITL/ FTP (only dilated 1.5 cm, failed IOL).   G2- performed for repeat only    Past Medical History:  Diagnosis Date  . Anxiety   . Bipolar 1 disorder (HCC)   . Bulging lumbar disc   . Bursitis   . Migraine headache   . Renal disorder    kidney stone  . Substance abuse (HCC)    cocaine, clonazapam, hydrocodone, oxycodone  . Trichomonal vulvovaginitis 04/2013    Past Surgical History:  Procedure Laterality Date  . CESAREAN SECTION      Current Outpatient Medications on File Prior to Visit  Medication Sig Dispense Refill  . buprenorphine-naloxone (SUBOXONE) 8-2 mg SUBL SL tablet Place 1 tablet under the tongue daily.     No current facility-administered medications on file prior to visit.    Allergies  Allergen  Reactions  . Vicodin Hp [Hydrocodone-Acetaminophen] Itching  . Hydrocodone-Acetaminophen Itching  . Latex Rash    Social History   Socioeconomic History  . Marital status: Single    Spouse name: Not on file  . Number of children: 3  . Years of education: Not on file  . Highest education level: Not on file  Occupational History  . Not on file  Tobacco Use  . Smoking status: Current Every Day Smoker    Packs/day: 0.25    Types: Cigarettes  . Smokeless tobacco: Never Used  Vaping Use  . Vaping Use: Never used  Substance and Sexual Activity  . Alcohol use: No  . Drug use: No    Comment: Hx of substance abuse: cocaine, benzodiazapines, oxycodone, hydrocodone  . Sexual activity: Yes    Partners: Male  Other Topics Concern  . Not on file  Social History Narrative  . Not on file   Social Determinants of Health   Financial Resource Strain: Not on file  Food Insecurity: Not on file  Transportation Needs: Not on file  Physical Activity: Not on file  Stress: Not on file  Social Connections: Not on file  Intimate Partner Violence: Not on file    Family History  Problem Relation Age of Onset  . Irregular heart beat Mother   .  Hypertension Mother   . Migraines Mother   . Rheum arthritis Maternal Grandmother   . Osteoarthritis Maternal Grandmother   . Diabetes Maternal Grandfather   . Rheum arthritis Maternal Grandfather   . Osteoarthritis Maternal Grandfather   . Heart disease Maternal Grandfather   . Other Brother 30       drug overdose  . Hyperlipidemia Father   . Hypertension Father   . Polycystic ovary syndrome Sister   . Diabetes Maternal Aunt     The following portions of the patient's history were reviewed and updated as appropriate: allergies, current medications, past family history, past medical history, past social history, past surgical history and problem list.  Review of Systems ROS- Negative other then what was reported above. Information obtained  verbally from patient.   Objective:   BP (!) 148/96   Pulse (!) 120   Ht 5\' 3"  (1.6 m)   Wt 68.5 kg   LMP 08/12/2020 (Exact Date)   BMI 26.75 kg/m     CONSTITUTIONAL: Well-developed, well-nourished female in no acute distress.   PHYSICAL EXAM: Not indicated.      EXAM: CT ABDOMEN AND PELVIS WITH CONTRAST  Date of Study: 08/08/2020  CLINICAL DATA:  Left lower quadrant pain.  Pelvic pain for 3 years.   TECHNIQUE: Multidetector CT imaging of the abdomen and pelvis was performed using the standard protocol following bolus administration of intravenous contrast.  CONTRAST:  10/08/2020 OMNIPAQUE IOHEXOL 300 MG/ML  SOLN  COMPARISON:  Pelvic ultrasound 03/29/2018.  CT 12/22/2010  FINDINGS: Lower chest: The lung bases are clear.  Heart is normal in size.  Hepatobiliary: No focal liver abnormality is seen. No gallstones, gallbladder wall thickening, or biliary dilatation.  Pancreas: Unremarkable. No pancreatic ductal dilatation or surrounding inflammatory changes.  Spleen: Normal in size without focal abnormality. Greatest splenic dimension 12.2 cm AP.  Adrenals/Urinary Tract: Normal adrenal glands. No hydronephrosis or perinephric edema. Homogeneous renal enhancement. No evidence of renal calculi or focal lesion. Urinary bladder is physiologically distended without wall thickening.  Stomach/Bowel: Stomach physiologically distended with contrast and unremarkable. Normal positioning of the duodenum and ligament of Treitz. Normal small bowel without obstruction, wall thickening, or inflammation. Normal terminal ileum. Normal appendix coiled behind the cecum, series 4, image 42. Moderate volume of stool in the ascending and descending colon. Moderate sigmoid stool burden with tortuosity. No colonic wall thickening, inflammation, or evidence of colonic mass. No significant diverticular disease.  Vascular/Lymphatic: Normal caliber abdominal aorta. Patent  portal vein. No acute vascular findings. No enlarged lymph nodes in the abdomen or pelvis.  Reproductive: Anteverted uterus, unremarkable in CT appearance. There is a 2.4 cm mildly complex cyst in the right ovary, likely hemorrhagic cyst. Left ovary appears quiescent. No left adnexal mass. Trace pelvic free fluid.  Other: No upper abdominal ascites. No abdominal wall hernia. No subcutaneous soft tissue abnormality.  Musculoskeletal: There are no acute or suspicious osseous abnormalities.  IMPRESSION: 1. A 2.4 cm mildly complex cyst in the right ovary, likely hemorrhagic cyst. Trace pelvic free fluid is likely physiologic. No follow-up imaging recommended. Note: This recommendation does not apply to premenarchal patients and to those with increased risk (genetic, family history, elevated tumor markers or other high-risk factors) of ovarian cancer. Reference: JACR 2020 Feb; 17(2):248-254 2. No findings to explain left lower quadrant abdominal pain. 3. Moderate colonic stool burden with tortuosity, can be seen with constipation.   Electronically Signed   By: 11-01-2000 M.D.   On: 08/09/2020 12:04  Assessment:   1. Right ovarian cyst  - US PELVIC COMPLETE WITH TRANSVAGINAL; Future     Plan:   Declined blood pressure recheck.  Discussed birth control pills, pain management options, and zofran. Patient declined all options at this time, using home treatment measures.  Referral for repeat ULTRASOUND x 6-8 weeks.  RTC if symptoms do not improve or get worse, or sooner if needed.  Juliann Pares, Student-MidWife [employer] 08/17/20 4:21 PM

## 2020-08-17 NOTE — Progress Notes (Signed)
I have seen, interviewed, and examined the patient in conjunction with the [employer] and affirm the diagnosis and management plan.   Gunnar Bulla, CNM [facility], [facility] 08/17/20 4:47 PM

## 2020-08-25 ENCOUNTER — Encounter: Payer: Medicaid Other | Admitting: Certified Nurse Midwife

## 2020-09-01 ENCOUNTER — Encounter: Payer: Self-pay | Admitting: Certified Nurse Midwife

## 2020-09-28 ENCOUNTER — Ambulatory Visit: Admission: RE | Admit: 2020-09-28 | Payer: Medicaid Other | Source: Ambulatory Visit

## 2020-10-16 ENCOUNTER — Encounter: Payer: Medicaid Other | Admitting: Certified Nurse Midwife

## 2020-10-23 ENCOUNTER — Encounter: Payer: Medicaid Other | Admitting: Certified Nurse Midwife

## 2020-11-03 ENCOUNTER — Other Ambulatory Visit: Payer: Self-pay

## 2020-11-03 ENCOUNTER — Ambulatory Visit (INDEPENDENT_AMBULATORY_CARE_PROVIDER_SITE_OTHER): Payer: Medicaid Other | Admitting: Certified Nurse Midwife

## 2020-11-03 ENCOUNTER — Encounter: Payer: Self-pay | Admitting: Certified Nurse Midwife

## 2020-11-03 VITALS — BP 121/79 | HR 87 | Resp 16 | Ht 63.0 in | Wt 162.7 lb

## 2020-11-03 DIAGNOSIS — N926 Irregular menstruation, unspecified: Secondary | ICD-10-CM | POA: Diagnosis not present

## 2020-11-03 DIAGNOSIS — N83201 Unspecified ovarian cyst, right side: Secondary | ICD-10-CM

## 2020-11-03 DIAGNOSIS — R1030 Lower abdominal pain, unspecified: Secondary | ICD-10-CM | POA: Diagnosis not present

## 2020-11-03 NOTE — Patient Instructions (Signed)
Ovarian Cyst  An ovarian cyst is a fluid-filled sac on an ovary. Most of these cysts go away on their own and are not cancer. Some cysts need treatment. What are the causes?  Ovarian hyperstimulation syndrome. Some medicines may lead to this problem.  Polycystic ovarian syndrome (PCOS). Problems with body chemicals (hormones) can lead to this condition.  The normal menstrual cycle. What increases the risk?  Being overweight or very overweight.  Taking medicines to increase your chance of getting pregnant.  Using some types of birth control.  Smoking. What are the signs or symptoms? Many ovarian cysts do not cause symptoms. If you get symptoms, you may have:  Pain or pressure in the area between the hip bones.  Pain in the lower belly.  Pain during sex.  Swelling in the lower belly.  Periods that are not regular.  Pain with periods. How is this treated? Many ovarian cysts go away on their own without treatment. If you need treatment, it may include:  Medicines for pain.  Fluid taken out of the cyst.  The cyst being taken out.  Birth control pills or other medicines.  Surgery to remove the ovary. Follow these instructions at home:  Take over-the-counter and prescription medicines only as told by your doctor.  Ask your doctor if you should avoid driving or using machines while you are taking your medicine.  Get exams and Pap tests as told by your doctor.  Return to your normal activities when your doctor says that it is safe.  Do not smoke or use any products that contain nicotine or tobacco. If you need help quitting, ask your doctor.  Keep all follow-up visits. Contact a doctor if:  Your periods: ? Are late. ? Are not regular. ? Stop. ? Are painful.  You have pain in the area between your hip bones, and the pain does not go away.  You feel pressure on your bladder.  You have trouble peeing.  You feel full, or your belly hurts, swells, or  bloats.  You gain or lose weight without trying, or you are less hungry than normal.  You feel pain and pressure in your back.  You feel pain and pressure in the area between your hip bones.  You think you may be pregnant. Get help right away if:  You have pain in your belly that is very bad or gets worse.  You have pain in the area between your hip bones, and the pain is very bad or gets worse.  You cannot eat or drink without vomiting.  You get a fever or chills all of a sudden.  Your period is a lot heavier than usual. Summary  An ovarian cyst is a fluid-filled sac on an ovary.  Some cysts may cause problems and need treatment.  Most of these cysts go away on their own. This information is not intended to replace advice given to you by your health care provider. Make sure you discuss any questions you have with your health care provider. Document Revised: 11/04/2019 Document Reviewed: 11/04/2019 Elsevier Patient Education  2021 [employer]vier Inc.  

## 2020-11-03 NOTE — Progress Notes (Signed)
GYN ENCOUNTER NOTE  Subjective:       Linda Thornton is a 30 y.o. [phone] female is here for gynecologic evaluation of the following issues:  1. Evaluation of irregular bleeding and lower abdominal pain that last three (3) to four (4) days approximately two (2) weeks ago; symptoms are now resolved  2. Questions "lost anal plug" after falling asleep with device inserted after intercourse. Endorses several successful bowel movements after incident.   Denies difficulty breathing or respiratory distress, chest pain, dysuria, and leg pain or swelling.    Gynecologic History  Patient's last menstrual period was 10/09/2020 (exact date).  Contraception: none  Last Pap: 06/2018. Results were: normal  Obstetric History OB History  Gravida Para Term Preterm AB Living  3 3 3     3   SAB IAB Ectopic Multiple Live Births          3    # Outcome Date GA Lbr Len/2nd Weight Sex Delivery Anes PTL Lv  3 Term 10/31/16   8 lb (3.629 kg) M CS-Unspec  N LIV  2 Term 10/12/14 [redacted]w[redacted]d  7 lb 6 oz (3.345 kg) M CS-Unspec  N LIV  1 Term 05/26/09 [redacted]w[redacted]d  7 lb 6 oz (3.345 kg) M CS-LTranv  N LIV     Complications: Fetal Intolerance, Thick meconium stained amniotic fluid    Obstetric Comments  G1 - C-section for FITL/ FTP (only dilated 1.5 cm, failed IOL).   G2- performed for repeat only    Past Medical History:  Diagnosis Date  . Anxiety   . Bipolar 1 disorder (HCC)   . Bulging lumbar disc   . Bursitis   . Migraine headache   . Renal disorder    kidney stone  . Substance abuse (HCC)    cocaine, clonazapam, hydrocodone, oxycodone  . Trichomonal vulvovaginitis 04/2013    Past Surgical History:  Procedure Laterality Date  . CESAREAN SECTION      Current Outpatient Medications on File Prior to Visit  Medication Sig Dispense Refill  . buprenorphine-naloxone (SUBOXONE) 8-2 mg SUBL SL tablet Place 1 tablet under the tongue daily.     No current facility-administered medications on file prior to  visit.    Allergies  Allergen Reactions  . Vicodin Hp [Hydrocodone-Acetaminophen] Itching  . Hydrocodone-Acetaminophen Itching  . Latex Rash    Social History   Socioeconomic History  . Marital status: Single    Spouse name: Not on file  . Number of children: 3  . Years of education: Not on file  . Highest education level: Not on file  Occupational History  . Not on file  Tobacco Use  . Smoking status: Current Every Day Smoker    Packs/day: 0.25    Types: Cigarettes  . Smokeless tobacco: Never Used  Vaping Use  . Vaping Use: Never used  Substance and Sexual Activity  . Alcohol use: No  . Drug use: No    Comment: Hx of substance abuse: cocaine, benzodiazapines, oxycodone, hydrocodone  . Sexual activity: Yes    Partners: Male  Other Topics Concern  . Not on file  Social History Narrative  . Not on file   Social Determinants of Health   Financial Resource Strain: Not on file  Food Insecurity: Not on file  Transportation Needs: Not on file  Physical Activity: Not on file  Stress: Not on file  Social Connections: Not on file  Intimate Partner Violence: Not on file    Family History  Problem  Relation Age of Onset  . Irregular heart beat Mother   . Hypertension Mother   . Migraines Mother   . Rheum arthritis Maternal Grandmother   . Osteoarthritis Maternal Grandmother   . Diabetes Maternal Grandfather   . Rheum arthritis Maternal Grandfather   . Osteoarthritis Maternal Grandfather   . Heart disease Maternal Grandfather   . Other Brother 30       drug overdose  . Hyperlipidemia Father   . Hypertension Father   . Polycystic ovary syndrome Sister   . Diabetes Maternal Aunt     The following portions of the patient's history were reviewed and updated as appropriate: allergies, current medications, past family history, past medical history, past social history, past surgical history and problem list.  Review of Systems  ROS negative except as noted above.  Information obtained from patient.   Objective:   BP 121/79   Pulse 87   Resp 16   Ht 5\' 3"  (1.6 m)   Wt 162 lb 11.2 oz (73.8 kg)   LMP 10/09/2020 (Exact Date)   BMI 28.82 kg/m    CONSTITUTIONAL: Well-developed, well-nourished female in no acute distress.   ABDOMEN: Soft, non distended; left lower quadrant tenderness on palpation.   PELVIC:  External Genitalia: Normal  Vagina: Normal  Cervix: Normal  Uterus: Normal size, shape,consistency, mobile  Adnexa: Normal except for left sided tenderness  Rectal: Normal digital exam  MUSCULOSKELETAL: Normal range of motion. No tenderness.  No cyanosis, clubbing, or edema.  Assessment:   1. Right ovarian cyst  - 12/09/2020 PELVIC COMPLETE WITH TRANSVAGINAL; Future  2. Irregular menses  - US PELVIC COMPLETE WITH TRANSVAGINAL; Future  3. Lower abdominal pain  - US PELVIC COMPLETE WITH TRANSVAGINAL; Future     Plan:   Reassurance offered.   Rx Motrin, see orders.   Encouraged to reschedule cancelled pelvic ultrasound, see orders.   Reviewed red flag symptoms and when to call.   RTC as needed.    [place], CNM [facility], [facility]

## 2020-12-05 ENCOUNTER — Ambulatory Visit
Admission: RE | Admit: 2020-12-05 | Discharge: 2020-12-05 | Disposition: A | Discharge status: Home / Self Care | Payer: Medicaid Other | Source: Ambulatory Visit | Attending: Certified Nurse Midwife | Admitting: Certified Nurse Midwife

## 2020-12-05 ENCOUNTER — Other Ambulatory Visit: Payer: Self-pay

## 2020-12-05 DIAGNOSIS — N926 Irregular menstruation, unspecified: Secondary | ICD-10-CM

## 2020-12-05 DIAGNOSIS — R1030 Lower abdominal pain, unspecified: Secondary | ICD-10-CM

## 2020-12-05 DIAGNOSIS — N83201 Unspecified ovarian cyst, right side: Secondary | ICD-10-CM | POA: Diagnosis not present

## 2020-12-21 ENCOUNTER — Encounter: Payer: Medicaid Other | Admitting: Certified Nurse Midwife

## 2020-12-22 ENCOUNTER — Encounter: Payer: Self-pay | Admitting: Certified Nurse Midwife

## 2020-12-22 ENCOUNTER — Other Ambulatory Visit: Payer: Self-pay

## 2020-12-22 ENCOUNTER — Ambulatory Visit (INDEPENDENT_AMBULATORY_CARE_PROVIDER_SITE_OTHER): Payer: Medicaid Other | Admitting: Certified Nurse Midwife

## 2020-12-22 DIAGNOSIS — Z319 Encounter for procreative management, unspecified: Secondary | ICD-10-CM

## 2020-12-22 DIAGNOSIS — R102 Pelvic and perineal pain unspecified side: Secondary | ICD-10-CM

## 2020-12-22 DIAGNOSIS — Z8742 Personal history of other diseases of the female genital tract: Secondary | ICD-10-CM

## 2020-12-22 DIAGNOSIS — N926 Irregular menstruation, unspecified: Secondary | ICD-10-CM | POA: Diagnosis not present

## 2020-12-22 NOTE — Progress Notes (Signed)
Virtual Visit via Telephone Note  I connected with Linda Thornton on 12/22/20 at 11:45 AM EDT by telephone and verified that I am speaking with the correct person using two identifiers.  Location:  Patient: Linda Thornton (work)  Provider: Serafina Royals, CNM ([facility], [facility])   I discussed the limitations, risks, security and privacy concerns of performing an evaluation and management service by telephone and the availability of in person appointments. I also discussed with the patient that there may be a patient responsible charge related to this service. The patient expressed understanding and agreed to proceed.   History of Present Illness:  Patient called to discuss ultrasound results.  Desires pregnancy with new partner, no success with six (6) months of cycle tracking and well timed intercourse.   Notes menses a few days after ultrasound.    Observations/Objective:  CLINICAL DATA:  Irregular menses and pelvic pain, initial encounter   EXAM: TRANSABDOMINAL AND TRANSVAGINAL ULTRASOUND OF PELVIS   TECHNIQUE: Both transabdominal and transvaginal ultrasound examinations of the pelvis were performed. Transabdominal technique was performed for global imaging of the pelvis including uterus, ovaries, adnexal regions, and pelvic cul-de-sac. It was necessary to proceed with endovaginal exam following the transabdominal exam to visualize the ovaries.   COMPARISON:  08/08/2020 CT   FINDINGS: Uterus   Measurements: 9.3 x 4.6 x 4.9 cm. = volume: 111 mL. No fibroids or other mass visualized. Prior Caesarean defect is noted.   Endometrium   Thickness: 8 mm.  Mild fluid is noted within the endometrial canal.   Right ovary   Measurements: 2.2 x 2.2 x 1.3 cm. = volume: 3.4 mL. Normal appearance/no adnexal mass. Previously seen right ovarian cyst has resolved in the interval.   Left ovary   Measurements: 3.8 x 3.3 x 2.5 cm. = volume: 17 mL. 15 mm left ovarian  cyst is noted.   Other findings   No abnormal free fluid.   IMPRESSION: Mild fluid within the endometrial canal.   Small left ovarian cyst. Previously seen right ovarian cyst has resolved in the interval. No followup imaging recommended. Note: This recommendation does not apply to premenarchal patients or to those with increased risk (genetic, family history, elevated tumor markers or other high-risk factors) of ovarian cancer. Reference: Radiology 2019 Nov; 293(2):359-371.     Electronically Signed   By: Alcide Clever M.D.   On: 12/06/2020 14:49  Assessment:  1. Pelvic pain   2. Irregular menses   3. History of ovarian cyst   4. Patient desires pregnancy   Plan:  Ultrasound findings discussed with patient, verbalized understanding.   Advised fertility tracking and well timed intercourse for the next six (6) months followed by referral to reproductive endocrinology if needed.   Reviewed red flag symptoms and when to call.   RTC as needed.   Follow Up Instructions:    I discussed the assessment and treatment plan with the patient. The patient was provided an opportunity to ask questions and all were answered. The patient agreed with the plan and demonstrated an understanding of the instructions.   The patient was advised to call back or seek an in-person evaluation if the symptoms worsen or if the condition fails to improve as anticipated.  I provided 8 minutes of non-face-to-face time during this encounter.   Serafina Royals, CNM [facility], [facility] 12/22/20 11:58 AM

## 2021-03-15 ENCOUNTER — Telehealth: Payer: Self-pay | Admitting: Obstetrics and Gynecology

## 2021-03-15 NOTE — Telephone Encounter (Signed)
Pt called asking for a refill on Metrogel, pt was seen by michelle in the past, I made her aware that she is no longer here. Confirmed pharmacy as [facility] on [address]. Please Advise.

## 2021-03-19 ENCOUNTER — Other Ambulatory Visit: Payer: Self-pay

## 2021-03-19 MED ORDER — METRONIDAZOLE 0.75 % VA GEL: 1.0000 | Freq: Every day | VAGINAL | 5 refills | Status: DC

## 2021-03-19 NOTE — Progress Notes (Signed)
She is having discharge with odor x 3 days. She has had issues in the past with BV. She gets symptoms when she gets her cycle and with sex. Refill sent to pharmacy on file.

## 2021-04-17 ENCOUNTER — Encounter: Payer: Self-pay | Admitting: Obstetrics and Gynecology

## 2021-04-17 ENCOUNTER — Other Ambulatory Visit: Payer: Self-pay

## 2021-04-17 ENCOUNTER — Ambulatory Visit (INDEPENDENT_AMBULATORY_CARE_PROVIDER_SITE_OTHER): Payer: Medicaid Other | Admitting: Obstetrics and Gynecology

## 2021-04-17 VITALS — BP 118/65 | HR 96 | Ht 63.0 in | Wt 158.6 lb

## 2021-04-17 DIAGNOSIS — Z3200 Encounter for pregnancy test, result unknown: Secondary | ICD-10-CM

## 2021-04-17 LAB — POCT URINE PREGNANCY: Preg Test, Ur: POSITIVE — AB

## 2021-04-17 NOTE — Progress Notes (Signed)
HPI:      Linda Thornton is a 30 y.o. [phone] who LMP was No LMP recorded.  Subjective:   She presents today for pregnancy confirmation.  She has missed a menstrual period.  She was not specifically attempting pregnancy but not preventing.  She has a history of 3 prior cesarean deliveries.  Her last 2 babies delivered at [facility].  She is now living in this area. She is not currently experiencing nausea and vomiting of pregnancy.  She has just begun prenatal vitamins.    Hx: The following portions of the patient's history were reviewed and updated as appropriate:             She  has a past medical history of Anxiety, Bipolar 1 disorder (HCC), Bulging lumbar disc, Bursitis, Migraine headache, Renal disorder, Substance abuse (HCC), and Trichomonal vulvovaginitis (04/2013). She does not have any pertinent problems on file. She  has a past surgical history that includes Cesarean section. Her family history includes Diabetes in her maternal aunt and maternal grandfather; Heart disease in her maternal grandfather; Hyperlipidemia in her father; Hypertension in her father and mother; Irregular heart beat in her mother; Migraines in her mother; Osteoarthritis in her maternal grandfather and maternal grandmother; Other (age of onset: 47) in her brother; Polycystic ovary syndrome in her sister; Rheum arthritis in her maternal grandfather and maternal grandmother. She  reports that she has been smoking cigarettes. She has been smoking an average of .25 packs per day. She has never used smokeless tobacco. She reports that she does not drink alcohol and does not use drugs. She has a current medication list which includes the following prescription(s): metronidazole and buprenorphine-naloxone. She is allergic to vicodin hp [hydrocodone-acetaminophen], hydrocodone-acetaminophen, and latex.       Review of Systems:  Review of Systems  Constitutional: Denied constitutional symptoms, night sweats, recent illness,  fatigue, fever, insomnia and weight loss.  Eyes: Denied eye symptoms, eye pain, photophobia, vision change and visual disturbance.  Ears/Nose/Throat/Neck: Denied ear, nose, throat or neck symptoms, hearing loss, nasal discharge, sinus congestion and sore throat.  Cardiovascular: Denied cardiovascular symptoms, arrhythmia, chest pain/pressure, edema, exercise intolerance, orthopnea and palpitations.  Respiratory: Denied pulmonary symptoms, asthma, pleuritic pain, productive sputum, cough, dyspnea and wheezing.  Gastrointestinal: Denied, gastro-esophageal reflux, melena, nausea and vomiting.  Genitourinary: Denied genitourinary symptoms including symptomatic vaginal discharge, pelvic relaxation issues, and urinary complaints.  Musculoskeletal: Denied musculoskeletal symptoms, stiffness, swelling, muscle weakness and myalgia.  Dermatologic: Denied dermatology symptoms, rash and scar.  Neurologic: Denied neurology symptoms, dizziness, headache, neck pain and syncope.  Psychiatric: Denied psychiatric symptoms, anxiety and depression.  Endocrine: Denied endocrine symptoms including hot flashes and night sweats.   Meds:   Current Outpatient Medications on File Prior to Visit  Medication Sig Dispense Refill   metroNIDAZOLE (METROGEL) 0.75 % vaginal gel Place 1 Applicatorful vaginally at bedtime. Apply one applicatorful to vagina at bedtime for 10 days, then twice a week for 6 months. 70 g 5   buprenorphine-naloxone (SUBOXONE) 8-2 mg SUBL SL tablet Place 1 tablet under the tongue daily. (Patient not taking: Reported on 04/17/2021)     No current facility-administered medications on file prior to visit.      Objective:     Vitals:   04/17/21 1509  BP: 118/65  Pulse: 96   Filed Weights   04/17/21 1509  Weight: 158 lb 9.6 oz (71.9 kg)              Urinary pregnancy  test positive          Assessment:    G3P3003 Patient Active Problem List   Diagnosis Date Noted   Ovarian cyst, right  08/15/2020   Tobacco abuse 04/27/2016   Overweight (BMI 25.0-29.9) 04/27/2016   Previous cesarean section 09/11/2014   Sedative, hypnotic or anxiolytic use disorder, severe, in early remission (HCC) 07/13/2014   Adjustment disorder with anxiety 11/15/2013   Back pain 03/09/2012   Fibromyalgia 09/09/2011     1. Possible pregnancy, not yet confirmed     Approximately 5 to 6 weeks based on LMP.   Plan:            Prenatal Plan 1.  The patient was given prenatal literature. 2.  She was continued on prenatal vitamins. 3.  A prenatal lab panel to be drawn at nurse visit. 4.  An ultrasound was ordered to better determine an EDC. 5.  A nurse visit was scheduled. 6.  Genetic testing and testing for other inheritable conditions discussed in detail. She will decide in the future whether to have these labs performed. 7.  A general overview of pregnancy testing, visit schedule, ultrasound schedule, and prenatal care was discussed. 8.  COVID and its risks associated with pregnancy, prevention by limiting exposure and use of masks, as well as the risks and benefits of vaccination during pregnancy were discussed in detail.  [facility] policy regarding office and hospital visitation and testing was explained. 9.  Benefits of breast-feeding discussed in detail including both maternal and infant benefits. Ready Set Baby website discussed. 10.  Patient knows that she will have a fourth cesarean delivery at approximately 39 weeks estimated gestational age.  Orders Orders Placed This Encounter  Procedures   US OB Comp Less 14 Wks   POCT urine pregnancy    No orders of the defined types were placed in this encounter.     F/U  Return in about 7 weeks (around 06/05/2021). I spent 22 minutes involved in the care of this patient preparing to see the patient by obtaining and reviewing her medical history (including labs, imaging tests and prior procedures), documenting clinical information in the electronic  health record (EHR), counseling and coordinating care plans, writing and sending prescriptions, ordering tests or procedures and in direct communicating with the patient and medical staff discussing pertinent items from her history and physical exam.  Elonda Husky, M.D. 04/17/2021 3:28 PM

## 2021-04-17 NOTE — Progress Notes (Signed)
Pt is present today for confirmation of pregnancy. Pt LMP 03/07/2021. UPT done today results were positive. Pt stated that she is doing well no complaints.

## 2021-05-08 ENCOUNTER — Other Ambulatory Visit: Payer: Medicaid Other

## 2021-05-16 ENCOUNTER — Other Ambulatory Visit: Payer: Self-pay

## 2021-05-16 ENCOUNTER — Ambulatory Visit (INDEPENDENT_AMBULATORY_CARE_PROVIDER_SITE_OTHER): Payer: Medicaid Other

## 2021-05-16 DIAGNOSIS — Z3200 Encounter for pregnancy test, result unknown: Secondary | ICD-10-CM

## 2021-05-17 ENCOUNTER — Other Ambulatory Visit: Payer: Medicaid Other

## 2021-05-22 ENCOUNTER — Telehealth: Payer: Self-pay | Admitting: Obstetrics and Gynecology

## 2021-05-22 ENCOUNTER — Encounter: Payer: Self-pay | Admitting: Obstetrics and Gynecology

## 2021-05-22 NOTE — Telephone Encounter (Signed)
Pt called stating that she is [redacted] weeks pregnant, she started having bleeding last night that was bright red when she wiped. Pt denies any cramping or pain. Pt is concerned since she has been under a lot of stress and does a lot of physical task at her job. Pt is concerned she has never had bleeding in previous pregnancies and is asking to consult medical staff. Please Advise.

## 2021-05-22 NOTE — Telephone Encounter (Signed)
I spoke with patient. She stated that she is having bleeding when she wipes. Not heavy or soaking a pad. She denies cramping or pain. I recommended that she monitor her bleeding. If she started bleeding like she was having a cycle or soaking a pad every 30 minutes with severe pain or cramping she should go to ED. She verbalized understanding.

## 2021-05-31 ENCOUNTER — Ambulatory Visit (INDEPENDENT_AMBULATORY_CARE_PROVIDER_SITE_OTHER): Payer: Medicaid Other | Admitting: Obstetrics and Gynecology

## 2021-05-31 ENCOUNTER — Other Ambulatory Visit: Payer: Self-pay

## 2021-05-31 VITALS — BP 117/79 | HR 99 | Resp 16 | Ht 63.0 in | Wt 157.4 lb

## 2021-05-31 DIAGNOSIS — Z3481 Encounter for supervision of other normal pregnancy, first trimester: Secondary | ICD-10-CM

## 2021-05-31 DIAGNOSIS — Z3A11 11 weeks gestation of pregnancy: Secondary | ICD-10-CM | POA: Diagnosis not present

## 2021-05-31 DIAGNOSIS — Z113 Encounter for screening for infections with a predominantly sexual mode of transmission: Secondary | ICD-10-CM | POA: Diagnosis not present

## 2021-05-31 NOTE — Progress Notes (Signed)
Linda Thornton presents for NOB nurse interview visit. Pregnancy confirmation done 04/17/2021.  [id number]. Pregnancy education material explained and given. No cats in the home. NOB labs ordered.  HIV labs and Drug screen were explained optional and she did not decline. Drug screen ordered. PNV encouraged. Genetic screening options discussed. Genetic testing: Ordered.  Pt may discuss with provider.  Financial policy reviewed. FMLA form reviewed and signed. Pt. To follow up with [facility] in 6 days for NOB physical.  All questions answered.

## 2021-05-31 NOTE — Patient Instructions (Signed)
First Trimester of Pregnancy °The first trimester of pregnancy starts on the first day of your last menstrual period until the end of week 12. This is also called months 1 through 3 of pregnancy. °Body changes during your first trimester °Your body goes through many changes during pregnancy. The changes usually return to normal after your baby is born. °Physical changes °You may gain or lose weight. °Your breasts may grow larger and hurt. The area around your nipples may get darker. °Dark spots or blotches may develop on your face. °You may have changes in your hair. °Health changes °You may feel like you might vomit (nauseous), and you may vomit. °You may have heartburn. °You may have headaches. °You may have trouble pooping (constipation). °Your gums may bleed. °Other changes °You may get tired easily. °You may pee (urinate) more often. °Your menstrual periods will stop. °You may not feel hungry. °You may want to eat certain kinds of food. °You may have changes in your emotions from day to day. °You may have more dreams. °Follow these instructions at home: °Medicines °Take over-the-counter and prescription medicines only as told by your doctor. Some medicines are not safe during pregnancy. °Take a prenatal vitamin that contains at least 600 micrograms (mcg) of folic acid. °Eating and drinking °Eat healthy meals that include: °Fresh fruits and vegetables. °Whole grains. °Good sources of protein, such as meat, eggs, or tofu. °Low-fat dairy products. °Avoid raw meat and unpasteurized juice, milk, and cheese. °If you feel like you may vomit, or you vomit: °Eat 4 or 5 small meals a day instead of 3 large meals. °Try eating a few soda crackers. °Drink liquids between meals instead of during meals. °You may need to take these actions to prevent or treat trouble pooping: °Drink enough fluids to keep your pee (urine) pale yellow. °Eat foods that are high in fiber. These include beans, whole grains, and fresh fruits and  vegetables. °Limit foods that are high in fat and sugar. These include fried or sweet foods. °Activity °Exercise only as told by your doctor. Most people can do their usual exercise routine during pregnancy. °Stop exercising if you have cramps or pain in your lower belly (abdomen) or low back. °Do not exercise if it is too hot or too humid, or if you are in a place of great height (high altitude). °Avoid heavy lifting. °If you choose to, you may have sex unless your doctor tells you not to. °Relieving pain and discomfort °Wear a good support bra if your breasts are sore. °Rest with your legs raised (elevated) if you have leg cramps or low back pain. °If you have bulging veins (varicose veins) in your legs: °Wear support hose as told by your doctor. °Raise your feet for 15 minutes, 3-4 times a day. °Limit salt in your food. °Safety °Wear your seat belt at all times when you are in a car. °Talk with your doctor if someone is hurting you or yelling at you. °Talk with your doctor if you are feeling sad or have thoughts of hurting yourself. °Lifestyle °Do not use hot tubs, steam rooms, or saunas. °Do not douche. Do not use tampons or scented sanitary pads. °Do not use herbal medicines, illegal drugs, or medicines that are not approved by your doctor. Do not drink alcohol. °Do not smoke or use any products that contain nicotine or tobacco. If you need help quitting, ask your doctor. °Avoid cat litter boxes and soil that is used by cats. These carry germs   that can cause harm to the baby and can cause a loss of your baby by miscarriage or stillbirth. °General instructions °Keep all follow-up visits. This is important. °Ask for help if you need counseling or if you need help with nutrition. Your doctor can give you advice or tell you where to go for help. °Visit your dentist. At home, brush your teeth with a soft toothbrush. Floss gently. °Write down your questions. Take them to your prenatal visits. °Where to find more  information °American Pregnancy Association: americanpregnancy.org °American College of Obstetricians and Gynecologists: www.acog.org °Office on Women's Health: womenshealth.gov/pregnancy °Contact a doctor if: °You are dizzy. °You have a fever. °You have mild cramps or pressure in your lower belly. °You have a nagging pain in your belly area. °You continue to feel like you may vomit, you vomit, or you have watery poop (diarrhea) for 24 hours or longer. °You have a bad-smelling fluid coming from your vagina. °You have pain when you pee. °You are exposed to a disease that spreads from person to person, such as chickenpox, measles, Zika virus, HIV, or hepatitis. °Get help right away if: °You have spotting or bleeding from your vagina. °You have very bad belly cramping or pain. °You have shortness of breath or chest pain. °You have any kind of injury, such as from a fall or a car crash. °You have new or increased pain, swelling, or redness in an arm or leg. °Summary °The first trimester of pregnancy starts on the first day of your last menstrual period until the end of week 12 (months 1 through 3). °Eat 4 or 5 small meals a day instead of 3 large meals. °Do not smoke or use any products that contain nicotine or tobacco. If you need help quitting, ask your doctor. °Keep all follow-up visits. °This information is not intended to replace advice given to you by your health care provider. Make sure you discuss any questions you have with your health care provider. °Document Revised: 11/03/2019 Document Reviewed: 09/09/2019 °Elsevier Patient Education © 2022 Elsevier Inc. °Commonly Asked Questions During Pregnancy ° °Cats: A parasite can be excreted in cat feces.  To avoid exposure you need to have another person empty the little box.  If you must empty the litter box you will need to wear gloves.  Wash your hands after handling your cat.  This parasite can also be found in raw or undercooked meat so this should also be  avoided. ° °Colds, Sore Throats, Flu: Please check your medication sheet to see what you can take for symptoms.  If your symptoms are unrelieved by these medications please call the office. ° °Dental Work: Most any dental work your dentist recommends is permitted.  X-rays should only be taken during the first trimester if absolutely necessary.  Your abdomen should be shielded with a lead apron during all x-rays.  Please notify your provider prior to receiving any x-rays.  Novocaine is fine; gas is not recommended.  If your dentist requires a note from us prior to dental work please call the office and we will provide one for you. ° °Exercise: Exercise is an important part of staying healthy during your pregnancy.  You may continue most exercises you were accustomed to prior to pregnancy.  Later in your pregnancy you will most likely notice you have difficulty with activities requiring balance like riding a bicycle.  It is important that you listen to your body and avoid activities that put you at a higher risk   of falling.  Adequate rest and staying well hydrated are a must!  If you have questions about the safety of specific activities ask your provider.   ° °Exposure to Children with illness: Try to avoid obvious exposure; report any symptoms to us when noted,  If you have chicken pos, red measles or mumps, you should be immune to these diseases.   Please do not take any vaccines while pregnant unless you have checked with your OB provider. ° °Fetal Movement: After 28 weeks we recommend you do "kick counts" twice daily.  Lie or sit down in a calm quiet environment and count your baby movements "kicks".  You should feel your baby at least 10 times per hour.  If you have not felt 10 kicks within the first hour get up, walk around and have something sweet to eat or drink then repeat for an additional hour.  If count remains less than 10 per hour notify your provider. ° °Fumigating: Follow your pest control agent's  advice as to how long to stay out of your home.  Ventilate the area well before re-entering. ° °Hemorrhoids:   Most over-the-counter preparations can be used during pregnancy.  Check your medication to see what is safe to use.  It is important to use a stool softener or fiber in your diet and to drink lots of liquids.  If hemorrhoids seem to be getting worse please call the office.  ° °Hot Tubs:  Hot tubs Jacuzzis and saunas are not recommended while pregnant.  These increase your internal body temperature and should be avoided. ° °Intercourse:  Sexual intercourse is safe during pregnancy as long as you are comfortable, unless otherwise advised by your provider.  Spotting may occur after intercourse; report any bright red bleeding that is heavier than spotting. ° °Labor:  If you know that you are in labor, please go to the hospital.  If you are unsure, please call the office and let us help you decide what to do. ° °Lifting, straining, etc:  If your job requires heavy lifting or straining please check with your provider for any limitations.  Generally, you should not lift items heavier than that you can lift simply with your hands and arms (no back muscles) ° °Painting:  Paint fumes do not harm your pregnancy, but may make you ill and should be avoided if possible.  Latex or water based paints have less odor than oils.  Use adequate ventilation while painting. ° °Permanents & Hair Color:  Chemicals in hair dyes are not recommended as they cause increase hair dryness which can increase hair loss during pregnancy.  " Highlighting" and permanents are allowed.  Dye may be absorbed differently and permanents may not hold as well during pregnancy. ° °Sunbathing:  Use a sunscreen, as skin burns easily during pregnancy.  Drink plenty of fluids; avoid over heating. ° °Tanning Beds:  Because their possible side effects are still unknown, tanning beds are not recommended. ° °Ultrasound Scans:  Routine ultrasounds are performed  at approximately 20 weeks.  You will be able to see your baby's general anatomy an if you would like to know the gender this can usually be determined as well.  If it is questionable when you conceived you may also receive an ultrasound early in your pregnancy for dating purposes.  Otherwise ultrasound exams are not routinely performed unless there is a medical necessity.  Although you can request a scan we ask that you pay for it when conducted   because insurance does not cover " patient request" scans. ° °Work: If your pregnancy proceeds without complications you may work until your due date, unless your physician or employer advises otherwise. ° °Round Ligament Pain/Pelvic Discomfort:  Sharp, shooting pains not associated with bleeding are fairly common, usually occurring in the second trimester of pregnancy.  They tend to be worse when standing up or when you remain standing for long periods of time.  These are the result of pressure of certain pelvic ligaments called "round ligaments".  Rest, Tylenol and heat seem to be the most effective relief.  As the womb and fetus grow, they rise out of the pelvis and the discomfort improves.  Please notify the office if your pain seems different than that described.  It may represent a more serious condition. ° °Morning Sickness °Morning sickness is when you feel like you may vomit (feel nauseous) during pregnancy. Sometimes, you may vomit. Morning sickness most often happens in the morning, but it can also happen at any time of the day. Some women may have morning sickness that makes them vomit all the time. This is a more serious problem that needs treatment. °What are the causes? °The cause of this condition is not known. °What increases the risk? °You had vomiting or a feeling like you may vomit before your pregnancy. °You had morning sickness in another pregnancy. °You are pregnant with more than one baby, such as twins. °What are the signs or symptoms? °Feeling like  you may vomit. °Vomiting. °How is this treated? °Treatment is usually not needed for this condition. You may only need to change what you eat. In some cases, your doctor may give you some things to take for your condition. These include: °Vitamin B6 supplements. °Medicines to treat the feeling that you may vomit. °Ginger. °Follow these instructions at home: °Medicines °Take over-the-counter and prescription medicines only as told by your doctor. Do not take any medicines until you talk with your doctor about them first. °Take multivitamins before you get pregnant. These can stop or lessen the symptoms of morning sickness. °Eating and drinking °Eat dry toast or crackers before getting out of bed. °Eat 5 or 6 small meals a day. °Eat dry and bland foods like rice and baked potatoes. °Do not eat greasy, fatty, or spicy foods. °Have someone cook for you if the smell of food causes you to vomit or to feel like you may vomit. °If you feel like you may vomit after taking prenatal vitamins, take them at night or with a snack. °Eat protein foods when you need a snack. Nuts, yogurt, and cheese are good choices. °Drink fluids throughout the day. °Try ginger ale made with real ginger, ginger tea made from fresh grated ginger, or ginger candies. °General instructions °Do not smoke or use any products that contain nicotine or tobacco. If you need help quitting, ask your doctor. °Use an air purifier to keep the air in your house free of smells. °Get lots of fresh air. °Try to avoid smells that make you feel sick. °Try wearing an acupressure wristband. This is a wristband that is used to treat seasickness. °Try a treatment called acupuncture. In this treatment, a doctor puts needles into certain areas of your body to make you feel better. °Contact a doctor if: °You need medicine to feel better. °You feel dizzy or light-headed. °You are losing weight. °Get help right away if: °The feeling that you may vomit will not go away, or you  cannot   stop vomiting. °You faint. °You have very bad pain in your belly. °Summary °Morning sickness is when you feel like you may vomit (feel nauseous) during pregnancy. °You may feel sick in the morning, but you can feel this way at any time of the day. °Making some changes to what you eat may help your symptoms go away. °This information is not intended to replace advice given to you by your health care provider. Make sure you discuss any questions you have with your health care provider. °Document Revised: 01/10/2020 Document Reviewed: 12/20/2019 °Elsevier Patient Education © 2022 Elsevier Inc. ° °

## 2021-06-01 LAB — PAIN MGT SCRN (14 DRUGS), UR
Amphetamine Scrn, Ur: POSITIVE ng/mL — AB
BARBITURATE SCREEN URINE: NEGATIVE ng/mL
BENZODIAZEPINE SCREEN, URINE: NEGATIVE ng/mL
Buprenorphine, Urine: NEGATIVE ng/mL
CANNABINOIDS UR QL SCN: NEGATIVE ng/mL
Cocaine (Metab) Scrn, Ur: NEGATIVE ng/mL
Creatinine(Crt), U: 113.4 mg/dL (ref 20.0–300.0)
Fentanyl, Urine: NEGATIVE pg/mL
Meperidine Screen, Urine: NEGATIVE ng/mL
Methadone Screen, Urine: NEGATIVE ng/mL
OXYCODONE+OXYMORPHONE UR QL SCN: NEGATIVE ng/mL
Opiate Scrn, Ur: NEGATIVE ng/mL
Ph of Urine: 6.7 (ref 4.5–8.9)
Phencyclidine Qn, Ur: NEGATIVE ng/mL
Propoxyphene Scrn, Ur: NEGATIVE ng/mL
Tramadol Screen, Urine: NEGATIVE ng/mL

## 2021-06-01 LAB — URINALYSIS, ROUTINE W REFLEX MICROSCOPIC
Bilirubin, UA: NEGATIVE
Glucose, UA: NEGATIVE
Ketones, UA: NEGATIVE
Leukocytes,UA: NEGATIVE
Nitrite, UA: NEGATIVE
Protein,UA: NEGATIVE
RBC, UA: NEGATIVE
Specific Gravity, UA: 1.021 (ref 1.005–1.030)
Urobilinogen, Ur: 0.2 mg/dL (ref 0.2–1.0)
pH, UA: 7 (ref 5.0–7.5)

## 2021-06-03 LAB — CULTURE, OB URINE

## 2021-06-03 LAB — URINE CULTURE, OB REFLEX

## 2021-06-06 ENCOUNTER — Other Ambulatory Visit (HOSPITAL_COMMUNITY)
Admission: RE | Admit: 2021-06-06 | Discharge: 2021-06-06 | Disposition: A | Discharge status: Home / Self Care | Payer: Medicaid Other | Source: Ambulatory Visit | Attending: Obstetrics and Gynecology | Admitting: Obstetrics and Gynecology

## 2021-06-06 ENCOUNTER — Ambulatory Visit (INDEPENDENT_AMBULATORY_CARE_PROVIDER_SITE_OTHER): Payer: Medicaid Other | Admitting: Obstetrics and Gynecology

## 2021-06-06 ENCOUNTER — Encounter: Payer: Self-pay | Admitting: Obstetrics and Gynecology

## 2021-06-06 ENCOUNTER — Other Ambulatory Visit: Payer: Self-pay

## 2021-06-06 VITALS — BP 125/84 | HR 93 | Wt 159.4 lb

## 2021-06-06 DIAGNOSIS — Z3481 Encounter for supervision of other normal pregnancy, first trimester: Secondary | ICD-10-CM

## 2021-06-06 DIAGNOSIS — Z124 Encounter for screening for malignant neoplasm of cervix: Secondary | ICD-10-CM | POA: Insufficient documentation

## 2021-06-06 DIAGNOSIS — Z3A12 12 weeks gestation of pregnancy: Secondary | ICD-10-CM

## 2021-06-06 DIAGNOSIS — Z1379 Encounter for other screening for genetic and chromosomal anomalies: Secondary | ICD-10-CM

## 2021-06-06 LAB — POCT URINALYSIS DIPSTICK OB
Bilirubin, UA: NEGATIVE
Blood, UA: NEGATIVE
Glucose, UA: NEGATIVE
Ketones, UA: NEGATIVE
Leukocytes, UA: NEGATIVE
Nitrite, UA: NEGATIVE
POC,PROTEIN,UA: NEGATIVE
Spec Grav, UA: 1.01 (ref 1.010–1.025)
Urobilinogen, UA: 0.2 E.U./dL
pH, UA: 7.5 (ref 5.0–8.0)

## 2021-06-06 LAB — GC/CHLAMYDIA PROBE AMP
Chlamydia trachomatis, NAA: NEGATIVE
Neisseria Gonorrhoeae by PCR: NEGATIVE

## 2021-06-06 NOTE — Progress Notes (Signed)
NOB Physical: She has been having some itching and pain underneath her left armpit. She has no other concerns today. Pregnancy Medicaid form completed.

## 2021-06-06 NOTE — Progress Notes (Signed)
NOB: Patient states still having some nausea but no vomiting.  She has stopped her Vyvanse and says she is doing fine without it.  Reports history of uterine rupture with labor with last baby.  Sent for old records regarding possibility of early cesarean delivery.  Pap smear performed.  MaterniT 21 today.  Consider AFP next visit.  Physical examination General NAD, Conversant  HEENT Atraumatic; Op clear with mmm.  Normo-cephalic. Pupils reactive. Anicteric sclerae  Thyroid/Neck Smooth without nodularity or enlargement. Normal ROM.  Neck Supple.  Skin No rashes, lesions or ulceration. Normal palpated skin turgor. No nodularity.  Breasts: No masses or discharge.  Symmetric.  No axillary adenopathy.  Lungs: Clear to auscultation.No rales or wheezes. Normal Respiratory effort, no retractions.  Heart: NSR.  No murmurs or rubs appreciated. No periferal edema  Abdomen: Soft.  Non-tender.  No masses.  No HSM. No hernia  Extremities: Moves all appropriately.  Normal ROM for age. No lymphadenopathy.  Neuro: Oriented to PPT.  Normal mood. Normal affect.     Pelvic:   Vulva: Normal appearance.  No lesions.  Vagina: No lesions or abnormalities noted.  Support: Normal pelvic support.  Urethra No masses tenderness or scarring.  Meatus Normal size without lesions or prolapse.  Cervix: Normal appearance.  No lesions.  Anus: Normal exam.  No lesions.  Perineum: Normal exam.  No lesions.        Bimanual   Adnexae: No masses.  Non-tender to palpation.  Uterus: Enlarged. 148  Non-tender.  Mobile.  AV.  Adnexae: No masses.  Non-tender to palpation.  Cul-de-sac: Negative for abnormality.  Adnexae: No masses.  Non-tender to palpation.         Pelvimetry   Diagonal: Reached.  Spines: Average.  Sacrum: Concave.  Pubic Arch: Normal.    Ultrasound performed to identify fetal heart rate.

## 2021-06-07 LAB — VARICELLA ZOSTER ANTIBODY, IGG: Varicella zoster IgG: 320 index (ref >165)

## 2021-06-07 LAB — HIV ANTIBODY (ROUTINE TESTING W REFLEX): HIV Screen 4th Generation wRfx: NONREACTIVE

## 2021-06-07 LAB — PARVOVIRUS B19 ANTIBODY, IGG AND IGM
Parvovirus B19 IgG: 0.1 index (ref 0.0–0.8)
Parvovirus B19 IgM: 0.3 index (ref 0.0–0.8)

## 2021-06-07 LAB — ABO AND RH: Rh Factor: POSITIVE

## 2021-06-07 LAB — HCV INTERPRETATION

## 2021-06-07 LAB — VIRAL HEPATITIS HBV, HCV
HCV Ab: <0.1 s/co ratio (ref 0.0–0.9)
Hep B Core Total Ab: NEGATIVE
Hep B Surface Ab, Qual: REACTIVE
Hepatitis B Surface Ag: NEGATIVE

## 2021-06-07 LAB — NICOTINE/COTININE METABOLITES
Cotinine: 131.4 ng/mL
Nicotine: 14.6 ng/mL

## 2021-06-07 LAB — ANTIBODY SCREEN: Antibody Screen: NEGATIVE

## 2021-06-07 LAB — RUBELLA SCREEN: Rubella Antibodies, IGG: 1.93 index (ref >0.99)

## 2021-06-07 LAB — RPR: RPR Ser Ql: NONREACTIVE

## 2021-06-08 LAB — CYTOLOGY - PAP: Diagnosis: NEGATIVE

## 2021-06-11 LAB — MATERNIT21  PLUS CORE+ESS+SCA, BLOOD

## 2021-07-04 ENCOUNTER — Encounter: Payer: Medicaid Other | Admitting: Obstetrics and Gynecology

## 2021-07-19 ENCOUNTER — Telehealth: Payer: Self-pay | Admitting: Obstetrics and Gynecology

## 2021-07-19 NOTE — Telephone Encounter (Signed)
Pt called about work Doctor, general practice- states that she use to work [employer] but was pulled to self-check out due to lifting restrictions- she now states that she stands all day and they have cut her hours. Pt is wanting to speak with someone about these accomodation. I am unsure what pt is asking- I ingquire about FMLA- she was unsure if it was paid or unpaid.

## 2021-07-19 NOTE — Telephone Encounter (Signed)
Tried to reach out to patient to clarify exactly what she needs. No answer, No VM.

## 2021-07-24 NOTE — Telephone Encounter (Signed)
Spoke with patient about her concerns and she is thinking about her options at this time.

## 2021-07-24 NOTE — Telephone Encounter (Signed)
Called patient and discussed your recommendations. Patient states she has discussed these concerns with her employer already and the companies HR department requests a letter from her doctor. Patient states she is requesting breaks every 2 hours due to prior pregnancy complications, sore legs after standing for 6 hours and having restless leg syndrome.   Please advise on how you would like to proceed.

## 2021-07-25 NOTE — Telephone Encounter (Signed)
Provided patient with recommendations via mychart

## 2021-08-01 ENCOUNTER — Ambulatory Visit (INDEPENDENT_AMBULATORY_CARE_PROVIDER_SITE_OTHER): Payer: Medicaid Other | Admitting: Obstetrics and Gynecology

## 2021-08-01 ENCOUNTER — Encounter: Payer: Self-pay | Admitting: Obstetrics and Gynecology

## 2021-08-01 ENCOUNTER — Other Ambulatory Visit: Payer: Self-pay

## 2021-08-01 VITALS — BP 132/82 | HR 114 | Wt 159.2 lb

## 2021-08-01 DIAGNOSIS — O0992 Supervision of high risk pregnancy, unspecified, second trimester: Secondary | ICD-10-CM

## 2021-08-01 DIAGNOSIS — Z3482 Encounter for supervision of other normal pregnancy, second trimester: Secondary | ICD-10-CM

## 2021-08-01 DIAGNOSIS — R102 Pelvic and perineal pain: Secondary | ICD-10-CM

## 2021-08-01 DIAGNOSIS — Z1379 Encounter for other screening for genetic and chromosomal anomalies: Secondary | ICD-10-CM

## 2021-08-01 DIAGNOSIS — O09299 Supervision of pregnancy with other poor reproductive or obstetric history, unspecified trimester: Secondary | ICD-10-CM | POA: Insufficient documentation

## 2021-08-01 DIAGNOSIS — Z98891 History of uterine scar from previous surgery: Secondary | ICD-10-CM

## 2021-08-01 DIAGNOSIS — Z3A20 20 weeks gestation of pregnancy: Secondary | ICD-10-CM

## 2021-08-01 DIAGNOSIS — O26892 Other specified pregnancy related conditions, second trimester: Secondary | ICD-10-CM

## 2021-08-01 LAB — POCT URINALYSIS DIPSTICK OB
Bilirubin, UA: NEGATIVE
Blood, UA: NEGATIVE
Glucose, UA: NEGATIVE
Ketones, UA: NEGATIVE
Leukocytes, UA: NEGATIVE
Nitrite, UA: NEGATIVE
POC,PROTEIN,UA: NEGATIVE
Spec Grav, UA: 1.01 (ref 1.010–1.025)
Urobilinogen, UA: 0.2 E.U./dL
pH, UA: 7.5 (ref 5.0–8.0)

## 2021-08-01 NOTE — Progress Notes (Signed)
ROB: Patient complains of leg pain and pelvic pain.  Pelvic pain exacerbated by fetal movement.  Patient reports that she stands at work all day, and is only allowed a 10 minute break for the 6 hours. Discussed use of belly band, pelvic floor physical therapy, compression stockings. Referral to PT placed. Will also provide pregnancy work restriction.  Is due for anatomy scan, will order.  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 Also with significant adhesions. Does not require earlier delivery.  Normal MaterniT21, for AFP today. RTC in 4 weeks.

## 2021-08-01 NOTE — Progress Notes (Signed)
ROB: she has concerns about swelling in her legs and pain she feels when baby moves.

## 2021-08-01 NOTE — Progress Notes (Signed)
ROB

## 2021-08-01 NOTE — Patient Instructions (Signed)

## 2021-08-04 LAB — AFP, SERUM, OPEN SPINA BIFIDA
AFP MoM: 1.32
AFP Value: 81.3 ng/mL
Gest. Age on Collection Date: 20.6 weeks
Maternal Age At EDD: 30.4 yr
OSBR Risk 1 IN: 4529
Test Results:: NEGATIVE
Weight: 159 [lb_av]

## 2021-08-10 ENCOUNTER — Encounter: Payer: Medicaid Other | Admitting: Obstetrics and Gynecology

## 2021-08-16 ENCOUNTER — Ambulatory Visit: Payer: Medicaid Other | Admitting: Physical Therapy

## 2021-08-17 ENCOUNTER — Other Ambulatory Visit: Payer: Medicaid Other

## 2021-08-22 ENCOUNTER — Ambulatory Visit: Payer: Medicaid Other | Attending: Obstetrics and Gynecology | Admitting: Physical Therapy

## 2021-08-29 ENCOUNTER — Ambulatory Visit (INDEPENDENT_AMBULATORY_CARE_PROVIDER_SITE_OTHER): Payer: Medicaid Other

## 2021-08-29 ENCOUNTER — Encounter: Payer: Self-pay | Admitting: Obstetrics and Gynecology

## 2021-08-29 ENCOUNTER — Other Ambulatory Visit: Payer: Self-pay

## 2021-08-29 ENCOUNTER — Ambulatory Visit (INDEPENDENT_AMBULATORY_CARE_PROVIDER_SITE_OTHER): Payer: Medicaid Other | Admitting: Obstetrics and Gynecology

## 2021-08-29 VITALS — BP 129/87 | HR 144 | Wt 160.3 lb

## 2021-08-29 DIAGNOSIS — Z3A24 24 weeks gestation of pregnancy: Secondary | ICD-10-CM

## 2021-08-29 DIAGNOSIS — Z3482 Encounter for supervision of other normal pregnancy, second trimester: Secondary | ICD-10-CM

## 2021-08-29 DIAGNOSIS — Z3A20 20 weeks gestation of pregnancy: Secondary | ICD-10-CM

## 2021-08-29 DIAGNOSIS — O0992 Supervision of high risk pregnancy, unspecified, second trimester: Secondary | ICD-10-CM

## 2021-08-29 NOTE — Progress Notes (Signed)
ROB: Says she is doing well.  Using Prilosec for heartburn and it is working.  For ultrasound today.  1 hour GCT next visit. ?

## 2021-08-29 NOTE — Progress Notes (Signed)
ROB. Patient states fetal movement, no pain or pressure at this time. She states she is curious about her EDD due to previous conversations about previous rupture. Patient states no questions or concerns at this time.   ?

## 2021-08-30 ENCOUNTER — Encounter: Payer: Medicaid Other | Admitting: Physical Therapy

## 2021-09-06 ENCOUNTER — Encounter: Payer: Medicaid Other | Admitting: Physical Therapy

## 2021-09-13 ENCOUNTER — Encounter: Payer: Medicaid Other | Admitting: Physical Therapy

## 2021-09-20 ENCOUNTER — Encounter: Payer: Medicaid Other | Admitting: Physical Therapy

## 2021-09-26 ENCOUNTER — Encounter: Payer: Self-pay | Admitting: Obstetrics and Gynecology

## 2021-09-26 ENCOUNTER — Ambulatory Visit (INDEPENDENT_AMBULATORY_CARE_PROVIDER_SITE_OTHER): Payer: Medicaid Other | Admitting: Obstetrics and Gynecology

## 2021-09-26 ENCOUNTER — Other Ambulatory Visit (INDEPENDENT_AMBULATORY_CARE_PROVIDER_SITE_OTHER): Payer: Medicaid Other

## 2021-09-26 VITALS — BP 102/69 | HR 106 | Wt 159.3 lb

## 2021-09-26 DIAGNOSIS — O43103 Malformation of placenta, unspecified, third trimester: Secondary | ICD-10-CM

## 2021-09-26 DIAGNOSIS — O0993 Supervision of high risk pregnancy, unspecified, third trimester: Secondary | ICD-10-CM

## 2021-09-26 DIAGNOSIS — Z3A28 28 weeks gestation of pregnancy: Secondary | ICD-10-CM

## 2021-09-26 DIAGNOSIS — O2612 Low weight gain in pregnancy, second trimester: Secondary | ICD-10-CM

## 2021-09-26 DIAGNOSIS — K219 Gastro-esophageal reflux disease without esophagitis: Secondary | ICD-10-CM | POA: Insufficient documentation

## 2021-09-26 DIAGNOSIS — O99613 Diseases of the digestive system complicating pregnancy, third trimester: Secondary | ICD-10-CM

## 2021-09-26 DIAGNOSIS — Z23 Encounter for immunization: Secondary | ICD-10-CM

## 2021-09-26 LAB — POCT URINALYSIS DIPSTICK OB
Bilirubin, UA: NEGATIVE
Blood, UA: NEGATIVE
Glucose, UA: NEGATIVE
Ketones, UA: NEGATIVE
Leukocytes, UA: NEGATIVE
Nitrite, UA: NEGATIVE
POC,PROTEIN,UA: NEGATIVE
Spec Grav, UA: 1.01 (ref 1.010–1.025)
Urobilinogen, UA: 0.2 E.U./dL
pH, UA: 7 (ref 5.0–8.0)

## 2021-09-26 MED ORDER — PANTOPRAZOLE SODIUM 20 MG PO TBEC: 20.0000 mg | DELAYED_RELEASE_TABLET | Freq: Two times a day (BID) | ORAL | 2 refills | Status: AC

## 2021-09-26 NOTE — Progress Notes (Signed)
ROB: She is doing well. No new concerns today. ?

## 2021-09-26 NOTE — Patient Instructions (Signed)

## 2021-09-26 NOTE — Progress Notes (Signed)
ROB: Doing well, no complaints.  For 28 week labs today (completed alternative glucola).  Plans to breastfeed, desires NuvaRing for contraception. For Tdap today, signed blood consent.   Has concerns about recent ultrasound noting Grade 2 placenta. Advised on need for f/u scans.  Will order next one in 2 weeks. Notes heartburn, not relieved by OTC Prilosec and Zantac, will prescribe Protonix.  Concerned about weight gai n(TWG 9 lbs), advised on increasing protein, carbs, consuming whole milk.  RTC in 2 weeks. To repeat UDS next visit. ? ?

## 2021-09-27 ENCOUNTER — Encounter: Payer: Medicaid Other | Admitting: Physical Therapy

## 2021-09-27 LAB — CBC WITH DIFF/PLATELET
Basophils Absolute: 0 10*3/uL (ref 0.0–0.2)
Basos: 0 %
EOS (ABSOLUTE): 0.1 10*3/uL (ref 0.0–0.4)
Eos: 1 %
Hematocrit: 34.6 % (ref 34.0–46.6)
Hemoglobin: 11.9 g/dL (ref 11.1–15.9)
Immature Grans (Abs): 0.1 10*3/uL (ref 0.0–0.1)
Immature Granulocytes: 1 %
Lymphocytes Absolute: 1.6 10*3/uL (ref 0.7–3.1)
Lymphs: 15 %
MCH: 32.9 pg (ref 26.6–33.0)
MCHC: 34.4 g/dL (ref 31.5–35.7)
MCV: 96 fL (ref 79–97)
Monocytes Absolute: 0.8 10*3/uL (ref 0.1–0.9)
Monocytes: 7 %
Neutrophils Absolute: 8.3 10*3/uL — ABNORMAL HIGH (ref 1.4–7.0)
Neutrophils: 76 %
Platelets: 304 10*3/uL (ref 150–450)
RBC: 3.62 x10E6/uL — ABNORMAL LOW (ref 3.77–5.28)
RDW: 12.6 % (ref 11.7–15.4)
WBC: 10.9 10*3/uL — ABNORMAL HIGH (ref 3.4–10.8)

## 2021-09-27 LAB — GLUCOSE TOLERANCE, 1 HOUR: Glucose, 1Hr PP: 97 mg/dL (ref 70–199)

## 2021-09-27 LAB — RPR: RPR Ser Ql: NONREACTIVE

## 2021-10-04 ENCOUNTER — Encounter: Payer: Medicaid Other | Admitting: Physical Therapy

## 2021-10-11 ENCOUNTER — Encounter: Payer: Medicaid Other | Admitting: Physical Therapy

## 2021-10-17 ENCOUNTER — Encounter: Payer: Self-pay | Admitting: Obstetrics and Gynecology

## 2021-10-17 ENCOUNTER — Encounter: Payer: Medicaid Other | Admitting: Obstetrics and Gynecology

## 2021-10-17 ENCOUNTER — Ambulatory Visit (INDEPENDENT_AMBULATORY_CARE_PROVIDER_SITE_OTHER): Payer: Medicaid Other

## 2021-10-17 ENCOUNTER — Ambulatory Visit (INDEPENDENT_AMBULATORY_CARE_PROVIDER_SITE_OTHER): Payer: Medicaid Other | Admitting: Obstetrics and Gynecology

## 2021-10-17 VITALS — BP 124/85 | HR 114 | Wt 165.4 lb

## 2021-10-17 DIAGNOSIS — O0993 Supervision of high risk pregnancy, unspecified, third trimester: Secondary | ICD-10-CM

## 2021-10-17 DIAGNOSIS — L299 Pruritus, unspecified: Secondary | ICD-10-CM

## 2021-10-17 DIAGNOSIS — Z3A31 31 weeks gestation of pregnancy: Secondary | ICD-10-CM

## 2021-10-17 DIAGNOSIS — O43103 Malformation of placenta, unspecified, third trimester: Secondary | ICD-10-CM | POA: Diagnosis not present

## 2021-10-17 DIAGNOSIS — R825 Elevated urine levels of drugs, medicaments and biological substances: Secondary | ICD-10-CM

## 2021-10-17 DIAGNOSIS — F1521 Other stimulant dependence, in remission: Secondary | ICD-10-CM

## 2021-10-17 LAB — POCT URINALYSIS DIPSTICK OB
Bilirubin, UA: NEGATIVE
Blood, UA: NEGATIVE
Glucose, UA: NEGATIVE
Ketones, UA: NEGATIVE
Nitrite, UA: NEGATIVE
Spec Grav, UA: <=1.005 — AB (ref 1.010–1.025)
Urobilinogen, UA: 0.2 E.U./dL
pH, UA: 6 (ref 5.0–8.0)

## 2021-10-17 MED ORDER — HYDROXYZINE HCL 25 MG PO TABS: 25.0000 mg | ORAL_TABLET | Freq: Four times a day (QID) | ORAL | 2 refills | Status: DC | PRN

## 2021-10-17 NOTE — Progress Notes (Signed)
ROB. Patient states fetal movement with pain and pressure. She states itching on palm of hands and feet,started approximately about a week go with no relief with OTC treatment. Patient reports some swelling in feet as well. Patient states no questions or concerns at this time.   ?

## 2021-10-17 NOTE — Progress Notes (Signed)
ROB: Complains of itching.  Bile acid salts ordered.  Ultrasound today for growth and placental follow-up (history of grade 2 placenta) .  Atarax ordered.   ?

## 2021-10-19 ENCOUNTER — Other Ambulatory Visit: Payer: Self-pay

## 2021-10-19 ENCOUNTER — Encounter: Payer: Self-pay | Admitting: Obstetrics and Gynecology

## 2021-10-19 DIAGNOSIS — K831 Obstruction of bile duct: Secondary | ICD-10-CM

## 2021-10-19 LAB — PAIN MGT SCRN (14 DRUGS), UR
Amphetamine Scrn, Ur: NEGATIVE ng/mL
BARBITURATE SCREEN URINE: NEGATIVE ng/mL
BENZODIAZEPINE SCREEN, URINE: NEGATIVE ng/mL
Buprenorphine, Urine: NEGATIVE ng/mL
CANNABINOIDS UR QL SCN: POSITIVE ng/mL — AB
Cocaine (Metab) Scrn, Ur: NEGATIVE ng/mL
Creatinine(Crt), U: 42.4 mg/dL (ref 20.0–300.0)
Fentanyl, Urine: NEGATIVE pg/mL
Meperidine Screen, Urine: NEGATIVE ng/mL
Methadone Screen, Urine: NEGATIVE ng/mL
OXYCODONE+OXYMORPHONE UR QL SCN: NEGATIVE ng/mL
Opiate Scrn, Ur: NEGATIVE ng/mL
Ph of Urine: 6.7 (ref 4.5–8.9)
Phencyclidine Qn, Ur: NEGATIVE ng/mL
Propoxyphene Scrn, Ur: NEGATIVE ng/mL
Tramadol Screen, Urine: NEGATIVE ng/mL

## 2021-10-19 LAB — BILE ACIDS, TOTAL: Bile Acids Total: 13.3 umol/L — ABNORMAL HIGH (ref 0.0–10.0)

## 2021-10-19 MED ORDER — URSODIOL 500 MG PO TABS: 500.0000 mg | ORAL_TABLET | Freq: Two times a day (BID) | ORAL | 3 refills | Status: DC

## 2021-10-19 NOTE — Progress Notes (Signed)
Rx being sent in due to chole statis dx per Dr. Logan Bores. Patient made aware ?

## 2021-10-22 NOTE — Telephone Encounter (Signed)
Spoke with patient regarding recommendations. Patient states understanding the addition of weekly NST monitoring. No other questions at this time.  ?

## 2021-10-30 ENCOUNTER — Encounter: Payer: Self-pay | Admitting: Obstetrics and Gynecology

## 2021-10-30 ENCOUNTER — Other Ambulatory Visit: Payer: Medicaid Other

## 2021-10-30 ENCOUNTER — Ambulatory Visit (INDEPENDENT_AMBULATORY_CARE_PROVIDER_SITE_OTHER): Payer: Medicaid Other | Admitting: Obstetrics and Gynecology

## 2021-10-30 VITALS — BP 118/88 | HR 105 | Wt 170.5 lb

## 2021-10-30 DIAGNOSIS — Z3A33 33 weeks gestation of pregnancy: Secondary | ICD-10-CM

## 2021-10-30 DIAGNOSIS — O26613 Liver and biliary tract disorders in pregnancy, third trimester: Secondary | ICD-10-CM

## 2021-10-30 DIAGNOSIS — O0993 Supervision of high risk pregnancy, unspecified, third trimester: Secondary | ICD-10-CM | POA: Diagnosis not present

## 2021-10-30 DIAGNOSIS — K831 Obstruction of bile duct: Secondary | ICD-10-CM

## 2021-10-30 LAB — POCT URINALYSIS DIPSTICK OB
Bilirubin, UA: NEGATIVE
Blood, UA: NEGATIVE
Clarity, UA: NEGATIVE
Glucose, UA: NEGATIVE
Ketones, UA: NEGATIVE
Leukocytes, UA: NEGATIVE
Nitrite, UA: NEGATIVE
POC,PROTEIN,UA: NEGATIVE
Spec Grav, UA: 1.01 (ref 1.010–1.025)
Urobilinogen, UA: 0.2 E.U./dL
pH, UA: 7.5 (ref 5.0–8.0)

## 2021-10-30 NOTE — Progress Notes (Signed)
ROB: Patient would like clarification on due date as she was positive for cholestasias. No other questions or concerns.

## 2021-10-30 NOTE — Progress Notes (Signed)
ROB: Doing well.  Reports daily fetal movement.  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  He has better itching has declined.  NST done today.  Plan weekly NSTs.  Plan for delivery at approximately 37 weeks estimated gestational age for cholestasis.  Patient also has grade 3 placenta -continue NSTs.

## 2021-11-07 ENCOUNTER — Other Ambulatory Visit: Payer: Medicaid Other

## 2021-11-07 ENCOUNTER — Ambulatory Visit (INDEPENDENT_AMBULATORY_CARE_PROVIDER_SITE_OTHER): Payer: Medicaid Other | Admitting: Obstetrics and Gynecology

## 2021-11-07 DIAGNOSIS — O26613 Liver and biliary tract disorders in pregnancy, third trimester: Secondary | ICD-10-CM

## 2021-11-07 DIAGNOSIS — O0993 Supervision of high risk pregnancy, unspecified, third trimester: Secondary | ICD-10-CM | POA: Diagnosis not present

## 2021-11-07 DIAGNOSIS — K831 Obstruction of bile duct: Secondary | ICD-10-CM

## 2021-11-07 DIAGNOSIS — Z3A34 34 weeks gestation of pregnancy: Secondary | ICD-10-CM

## 2021-11-07 LAB — POCT URINALYSIS DIPSTICK OB
Bilirubin, UA: NEGATIVE
Blood, UA: NEGATIVE
Glucose, UA: NEGATIVE
Ketones, UA: NEGATIVE
Leukocytes, UA: NEGATIVE
Nitrite, UA: NEGATIVE
POC,PROTEIN,UA: NEGATIVE
Spec Grav, UA: 1.01 (ref 1.010–1.025)
Urobilinogen, UA: 0.2 E.U./dL
pH, UA: 7 (ref 5.0–8.0)

## 2021-11-07 NOTE — Progress Notes (Signed)
ROB. Patient states fetal movement with increased pressure, recommended stronger belly bands than she is currently using. She states an increase and vomiting after eating and drinking, lots of discomfort. She states itching is much better. NST preformed today.  Patient states no questions or concerns at this time.  CD scheduled for 19th.

## 2021-11-13 ENCOUNTER — Other Ambulatory Visit: Payer: Self-pay

## 2021-11-13 DIAGNOSIS — Z3A36 36 weeks gestation of pregnancy: Secondary | ICD-10-CM

## 2021-11-13 DIAGNOSIS — O0993 Supervision of high risk pregnancy, unspecified, third trimester: Secondary | ICD-10-CM

## 2021-11-14 ENCOUNTER — Encounter: Payer: Medicaid Other | Admitting: Obstetrics and Gynecology

## 2021-11-14 ENCOUNTER — Other Ambulatory Visit: Payer: Medicaid Other

## 2021-11-15 ENCOUNTER — Ambulatory Visit (INDEPENDENT_AMBULATORY_CARE_PROVIDER_SITE_OTHER): Payer: Medicaid Other | Admitting: Obstetrics and Gynecology

## 2021-11-15 ENCOUNTER — Other Ambulatory Visit: Payer: Medicaid Other

## 2021-11-15 ENCOUNTER — Encounter: Payer: Self-pay | Admitting: Obstetrics and Gynecology

## 2021-11-15 ENCOUNTER — Telehealth: Payer: Self-pay | Admitting: Obstetrics and Gynecology

## 2021-11-15 VITALS — BP 110/84 | HR 130 | Wt 176.6 lb

## 2021-11-15 DIAGNOSIS — Z98891 History of uterine scar from previous surgery: Secondary | ICD-10-CM

## 2021-11-15 DIAGNOSIS — M25559 Pain in unspecified hip: Secondary | ICD-10-CM

## 2021-11-15 DIAGNOSIS — O0993 Supervision of high risk pregnancy, unspecified, third trimester: Secondary | ICD-10-CM

## 2021-11-15 DIAGNOSIS — O26893 Other specified pregnancy related conditions, third trimester: Secondary | ICD-10-CM

## 2021-11-15 DIAGNOSIS — O26613 Liver and biliary tract disorders in pregnancy, third trimester: Secondary | ICD-10-CM

## 2021-11-15 DIAGNOSIS — Z3A35 35 weeks gestation of pregnancy: Secondary | ICD-10-CM

## 2021-11-15 DIAGNOSIS — K831 Obstruction of bile duct: Secondary | ICD-10-CM

## 2021-11-15 DIAGNOSIS — O43193 Other malformation of placenta, third trimester: Secondary | ICD-10-CM | POA: Insufficient documentation

## 2021-11-15 LAB — POCT URINALYSIS DIPSTICK OB
Bilirubin, UA: NEGATIVE
Blood, UA: NEGATIVE
Glucose, UA: NEGATIVE
Ketones, UA: NEGATIVE
Nitrite, UA: NEGATIVE
POC,PROTEIN,UA: NEGATIVE
Spec Grav, UA: 1.01 (ref 1.010–1.025)
Urobilinogen, UA: 0.2 E.U./dL
pH, UA: 7 (ref 5.0–8.0)

## 2021-11-15 NOTE — Progress Notes (Signed)
ROB: She had a lot of severe pain last night and today, pain comes and goes. She denies bleeding.

## 2021-11-15 NOTE — Telephone Encounter (Signed)
Pt called about abnormal lab results she is concerned if she needs a antibiotic or not.  Please advise.

## 2021-11-15 NOTE — Telephone Encounter (Signed)
Called patient to inform her that no antibiotics were needed for the moderate Leukocytes in her urine. She verbalized understanding.

## 2021-11-15 NOTE — Progress Notes (Signed)
ROB: Patient complains of hip pain, pelvic pain, sometimes severe. Discussed use of birthing ball, hip stretches, Tylenol, warm baths. NST performed today was reviewed and was found to be reactive.  Continue recommended antenatal testing and prenatal care. RTC in 1 week, for cultures then. Due for final growth scan tomorrow.    NONSTRESS TEST INTERPRETATION  INDICATIONS:  Cholestasis of pregnancy, Grade 3 placenta  FHR baseline: 145 bpm RESULTS:Reactive COMMENTS: None   PLAN: 1. Continue fetal kick counts twice a day. 2. Continue antepartum testing as scheduled-weekly

## 2021-11-16 ENCOUNTER — Other Ambulatory Visit: Payer: Medicaid Other

## 2021-11-16 ENCOUNTER — Ambulatory Visit (INDEPENDENT_AMBULATORY_CARE_PROVIDER_SITE_OTHER): Payer: Medicaid Other

## 2021-11-16 DIAGNOSIS — O0993 Supervision of high risk pregnancy, unspecified, third trimester: Secondary | ICD-10-CM

## 2021-11-16 DIAGNOSIS — O43893 Other placental disorders, third trimester: Secondary | ICD-10-CM | POA: Diagnosis not present

## 2021-11-16 DIAGNOSIS — Z3A36 36 weeks gestation of pregnancy: Secondary | ICD-10-CM | POA: Diagnosis not present

## 2021-11-16 DIAGNOSIS — O26613 Liver and biliary tract disorders in pregnancy, third trimester: Secondary | ICD-10-CM

## 2021-11-21 ENCOUNTER — Encounter
Admission: RE | Admit: 2021-11-21 | Discharge: 2021-11-21 | Disposition: A | Discharge status: Home / Self Care | Payer: Medicaid Other | Source: Ambulatory Visit | Attending: Obstetrics and Gynecology | Admitting: Obstetrics and Gynecology

## 2021-11-21 HISTORY — DX: Personal history of urinary calculi: Z87.442

## 2021-11-21 NOTE — Patient Instructions (Signed)
Your procedure is scheduled on: Monday November 26, 2021. Report to Labor and Delivery 3rd floor @ 9 am, entrance through [employer]  Remember: Instructions that are not followed completely may result in serious medical risk,  up to and including death, or upon the discretion of your surgeon and anesthesiologist your  surgery may need to be rescheduled.     _X__ 1. Do not eat food or drink fluids after midnight the night before your procedure.                 No chewing gum or hard candies.   __X__2.  On the morning of surgery brush your teeth with toothpaste and water, you                may rinse your mouth with mouthwash if you wish.  Do not swallow any toothpaste of mouthwash.     _X__ 3.  No Alcohol for 24 hours before or after surgery.   _X__ 4.  Do Not Smoke or use e-cigarettes For 24 Hours Prior to Your Surgery.                 Do not use any chewable tobacco products for at least 6 hours prior to                 Surgery.  _X__  5.  Do not use any recreational drugs (marijuana, cocaine, heroin, ecstasy, MDMA or other)                For at least one week prior to your surgery.  Combination of these drugs with anesthesia                May have life threatening results.  ____  6.  Bring all medications with you on the day of surgery if instructed.   __X__  7.  Notify your doctor if there is any change in your medical condition      (cold, fever, infections).     Do not wear jewelry, make-up, hairpins, clips or nail polish. Do not wear lotions, powders, or perfumes. You may wear deodorant. Do not shave 48 hours prior to surgery. Men may shave face and neck. Do not bring valuables to the hospital.    [facility] is not responsible for any belongings or valuables.  Contacts, dentures or bridgework may not be worn into surgery. Leave your suitcase in the car. After surgery it may be brought to your room. For patients admitted to the hospital, discharge  time is determined by your treatment team.   Patients discharged the day of surgery will not be allowed to drive home.   Make arrangements for someone to be with you for the first 24 hours of your Same Day Discharge.   __X__ Take these medicines the morning of surgery with A SIP OF WATER:    1. pantoprazole (PROTONIX) 20 MG   2.   3.   4.  5.  6.  ____ Fleet Enema (as directed)   __x__ Use CHG Soap (or wipes) as directed  ____ Use Benzoyl Peroxide Gel as instructed  ____ Use inhalers on the day of surgery  ____ Stop metformin 2 days prior to surgery    ____ Take 1/2 of usual insulin dose the night before surgery. No insulin the morning          of surgery.   ____ Call your PCP, cardiologist, or Pulmonologist if taking Coumadin/Plavix/aspirin and ask  when to stop before your surgery.   __x__ One Week prior to surgery- Stop Anti-inflammatories such as Ibuprofen, Aleve, Advil, Motrin, meloxicam (MOBIC), diclofenac, etodolac, ketorolac, Toradol, Daypro, piroxicam, Goody's or BC powders. OK TO USE TYLENOL IF NEEDED   __x__ Do not start any vitamins and or supplements until after surgery.    ____ Bring C-Pap to the hospital.    If you have any questions regarding your pre-procedure instructions,  Please call Pre-admit Testing at [phone]

## 2021-11-22 ENCOUNTER — Inpatient Hospital Stay: Admission: RE | Admit: 2021-11-22 | Payer: Medicaid Other | Source: Ambulatory Visit

## 2021-11-22 ENCOUNTER — Encounter: Payer: Self-pay | Admitting: Obstetrics and Gynecology

## 2021-11-22 ENCOUNTER — Other Ambulatory Visit: Payer: Medicaid Other

## 2021-11-22 ENCOUNTER — Ambulatory Visit (INDEPENDENT_AMBULATORY_CARE_PROVIDER_SITE_OTHER): Payer: Medicaid Other | Admitting: Obstetrics and Gynecology

## 2021-11-22 VITALS — BP 117/80 | HR 106 | Wt 178.4 lb

## 2021-11-22 DIAGNOSIS — Z113 Encounter for screening for infections with a predominantly sexual mode of transmission: Secondary | ICD-10-CM

## 2021-11-22 DIAGNOSIS — O0993 Supervision of high risk pregnancy, unspecified, third trimester: Secondary | ICD-10-CM | POA: Diagnosis not present

## 2021-11-22 DIAGNOSIS — Z3685 Encounter for antenatal screening for Streptococcus B: Secondary | ICD-10-CM

## 2021-11-22 DIAGNOSIS — Z3A36 36 weeks gestation of pregnancy: Secondary | ICD-10-CM

## 2021-11-22 LAB — POCT URINALYSIS DIPSTICK OB
Bilirubin, UA: NEGATIVE
Blood, UA: NEGATIVE
Glucose, UA: NEGATIVE
Ketones, UA: NEGATIVE
Leukocytes, UA: NEGATIVE
Nitrite, UA: NEGATIVE
POC,PROTEIN,UA: NEGATIVE
Spec Grav, UA: <=1.005 — AB (ref 1.010–1.025)
Urobilinogen, UA: 0.2 E.U./dL
pH, UA: 7 (ref 5.0–8.0)

## 2021-11-22 NOTE — Progress Notes (Signed)
ROB: Patient states itching much improved.  Taking Actigall for cholestasis.  Cesarean delivery scheduled for Monday.  NST today reactive.  Patient reports daily fetal movement.

## 2021-11-22 NOTE — Progress Notes (Signed)
ROB. Patient states fetal movement with increased pain and pressure. GC/CT and GBS cultures ordered. Patient states no questions or concerns at this time.

## 2021-11-24 LAB — GC/CHLAMYDIA PROBE AMP
Chlamydia trachomatis, NAA: NEGATIVE
Neisseria Gonorrhoeae by PCR: NEGATIVE

## 2021-11-26 ENCOUNTER — Encounter
Admission: RE | Disposition: A | Discharge status: Home / Self Care | Payer: Self-pay | Source: Home / Self Care | Attending: Obstetrics and Gynecology

## 2021-11-26 ENCOUNTER — Encounter: Payer: Self-pay | Admitting: Obstetrics and Gynecology

## 2021-11-26 ENCOUNTER — Inpatient Hospital Stay: Payer: Medicaid Other | Admitting: Anesthesiology

## 2021-11-26 ENCOUNTER — Other Ambulatory Visit: Payer: Self-pay

## 2021-11-26 ENCOUNTER — Inpatient Hospital Stay
Admission: RE | Admit: 2021-11-26 | Discharge: 2021-11-28 | DRG: 786 | Disposition: A | Discharge status: Home / Self Care | Payer: Medicaid Other | Attending: Obstetrics and Gynecology | Admitting: Obstetrics and Gynecology

## 2021-11-26 DIAGNOSIS — O2662 Liver and biliary tract disorders in childbirth: Secondary | ICD-10-CM | POA: Diagnosis present

## 2021-11-26 DIAGNOSIS — O9902 Anemia complicating childbirth: Secondary | ICD-10-CM | POA: Diagnosis present

## 2021-11-26 DIAGNOSIS — O99824 Streptococcus B carrier state complicating childbirth: Secondary | ICD-10-CM | POA: Diagnosis present

## 2021-11-26 DIAGNOSIS — O34211 Maternal care for low transverse scar from previous cesarean delivery: Principal | ICD-10-CM | POA: Diagnosis present

## 2021-11-26 DIAGNOSIS — K831 Obstruction of bile duct: Secondary | ICD-10-CM | POA: Diagnosis present

## 2021-11-26 DIAGNOSIS — Z3A37 37 weeks gestation of pregnancy: Secondary | ICD-10-CM

## 2021-11-26 DIAGNOSIS — O26643 Intrahepatic cholestasis of pregnancy, third trimester: Secondary | ICD-10-CM

## 2021-11-26 DIAGNOSIS — Z3490 Encounter for supervision of normal pregnancy, unspecified, unspecified trimester: Secondary | ICD-10-CM

## 2021-11-26 LAB — TYPE AND SCREEN
ABO/RH(D): A POS
Antibody Screen: NEGATIVE

## 2021-11-26 LAB — CBC
HCT: 32.9 % — ABNORMAL LOW (ref 36.0–46.0)
Hemoglobin: 10.9 g/dL — ABNORMAL LOW (ref 12.0–15.0)
MCH: 30.7 pg (ref 26.0–34.0)
MCHC: 33.1 g/dL (ref 30.0–36.0)
MCV: 92.7 fL (ref 80.0–100.0)
Platelets: 285 10*3/uL (ref 150–400)
RBC: 3.55 MIL/uL — ABNORMAL LOW (ref 3.87–5.11)
RDW: 13.3 % (ref 11.5–15.5)
WBC: 12.7 10*3/uL — ABNORMAL HIGH (ref 4.0–10.5)
nRBC: 0 % (ref 0.0–0.2)

## 2021-11-26 LAB — ABO/RH: ABO/RH(D): A POS

## 2021-11-26 SURGERY — Surgical Case
Anesthesia: Spinal

## 2021-11-26 MED ORDER — ONDANSETRON HCL 4 MG/2ML IJ SOLN: 4.0000 mg | Freq: Three times a day (TID) | INTRAMUSCULAR | Status: DC | PRN

## 2021-11-26 MED ORDER — OXYTOCIN-SODIUM CHLORIDE 30-0.9 UT/500ML-% IV SOLN
2.5000 [IU]/h | INTRAVENOUS | Status: AC
  Administered 2021-11-26: 2.5 [IU]/h via INTRAVENOUS

## 2021-11-26 MED ORDER — MIDAZOLAM HCL 2 MG/2ML IJ SOLN
INTRAMUSCULAR | Status: DC | PRN
  Administered 2021-11-26: 2 mg via INTRAVENOUS

## 2021-11-26 MED ORDER — PHENYLEPHRINE HCL-NACL 20-0.9 MG/250ML-% IV SOLN
INTRAVENOUS | Status: DC | PRN
  Administered 2021-11-26: 50 ug/min via INTRAVENOUS

## 2021-11-26 MED ORDER — IBUPROFEN 600 MG PO TABS
600.0000 mg | ORAL_TABLET | Freq: Four times a day (QID) | ORAL | Status: DC
  Administered 2021-11-27 – 2021-11-28 (×5): 600 mg via ORAL
  Filled 2021-11-26 (×5): qty 1

## 2021-11-26 MED ORDER — PHENYLEPHRINE HCL-NACL 20-0.9 MG/250ML-% IV SOLN
INTRAVENOUS | Status: AC
  Filled 2021-11-26: qty 250

## 2021-11-26 MED ORDER — DIPHENHYDRAMINE HCL 50 MG/ML IJ SOLN: 12.5000 mg | INTRAMUSCULAR | Status: DC | PRN

## 2021-11-26 MED ORDER — OXYCODONE-ACETAMINOPHEN 5-325 MG PO TABS
1.0000 | ORAL_TABLET | ORAL | Status: DC | PRN
  Administered 2021-11-27: 1 via ORAL
  Administered 2021-11-27 (×2): 2 via ORAL
  Administered 2021-11-27: 1 via ORAL
  Administered 2021-11-27 – 2021-11-28 (×2): 2 via ORAL
  Filled 2021-11-26 (×4): qty 2
  Filled 2021-11-26: qty 1
  Filled 2021-11-26 (×2): qty 2

## 2021-11-26 MED ORDER — SENNOSIDES-DOCUSATE SODIUM 8.6-50 MG PO TABS
2.0000 | ORAL_TABLET | ORAL | Status: DC
  Administered 2021-11-26 – 2021-11-27 (×2): 2 via ORAL
  Filled 2021-11-26 (×2): qty 2

## 2021-11-26 MED ORDER — OXYTOCIN-SODIUM CHLORIDE 30-0.9 UT/500ML-% IV SOLN
INTRAVENOUS | Status: AC
  Filled 2021-11-26: qty 1000

## 2021-11-26 MED ORDER — ORAL CARE MOUTH RINSE: 15.0000 mL | Freq: Once | OROMUCOSAL | Status: AC

## 2021-11-26 MED ORDER — DIPHENHYDRAMINE HCL 25 MG PO CAPS: 25.0000 mg | ORAL_CAPSULE | ORAL | Status: DC | PRN

## 2021-11-26 MED ORDER — KETOROLAC TROMETHAMINE 30 MG/ML IJ SOLN
30.0000 mg | Freq: Four times a day (QID) | INTRAMUSCULAR | Status: AC | PRN
  Filled 2021-11-26: qty 1

## 2021-11-26 MED ORDER — EPHEDRINE 5 MG/ML INJ
INTRAVENOUS | Status: AC
  Filled 2021-11-26: qty 5

## 2021-11-26 MED ORDER — LIDOCAINE 5 % EX PTCH
MEDICATED_PATCH | CUTANEOUS | Status: AC
  Administered 2021-11-26: 1
  Filled 2021-11-26: qty 1

## 2021-11-26 MED ORDER — MORPHINE SULFATE (PF) 0.5 MG/ML IJ SOLN
INTRAMUSCULAR | Status: DC | PRN
  Administered 2021-11-26: .1 mg via INTRATHECAL

## 2021-11-26 MED ORDER — MORPHINE SULFATE (PF) 0.5 MG/ML IJ SOLN
INTRAMUSCULAR | Status: AC
  Filled 2021-11-26: qty 10

## 2021-11-26 MED ORDER — MEPERIDINE HCL 25 MG/ML IJ SOLN: 6.2500 mg | INTRAMUSCULAR | Status: DC | PRN

## 2021-11-26 MED ORDER — MIDAZOLAM HCL 2 MG/2ML IJ SOLN
INTRAMUSCULAR | Status: AC
  Filled 2021-11-26: qty 2

## 2021-11-26 MED ORDER — FENTANYL CITRATE (PF) 100 MCG/2ML IJ SOLN
INTRAMUSCULAR | Status: AC
  Filled 2021-11-26: qty 2

## 2021-11-26 MED ORDER — NALOXONE HCL 4 MG/10ML IJ SOLN: 1.0000 ug/kg/h | INTRAVENOUS | Status: DC | PRN

## 2021-11-26 MED ORDER — ZOLPIDEM TARTRATE 5 MG PO TABS: 5.0000 mg | ORAL_TABLET | Freq: Every evening | ORAL | Status: DC | PRN

## 2021-11-26 MED ORDER — MENTHOL 3 MG MT LOZG: 1.0000 | LOZENGE | OROMUCOSAL | Status: DC | PRN

## 2021-11-26 MED ORDER — ONDANSETRON HCL 4 MG/2ML IJ SOLN
INTRAMUSCULAR | Status: AC
  Filled 2021-11-26: qty 2

## 2021-11-26 MED ORDER — LACTATED RINGERS IV SOLN: INTRAVENOUS | Status: DC

## 2021-11-26 MED ORDER — LIDOCAINE HCL (PF) 1 % IJ SOLN
INTRAMUSCULAR | Status: DC | PRN
  Administered 2021-11-26: 2 mL via SUBCUTANEOUS

## 2021-11-26 MED ORDER — NALOXONE HCL 0.4 MG/ML IJ SOLN: 0.4000 mg | INTRAMUSCULAR | Status: DC | PRN

## 2021-11-26 MED ORDER — SIMETHICONE 80 MG PO CHEW
80.0000 mg | CHEWABLE_TABLET | Freq: Four times a day (QID) | ORAL | Status: DC
  Administered 2021-11-26 – 2021-11-28 (×8): 80 mg via ORAL
  Filled 2021-11-26 (×8): qty 1

## 2021-11-26 MED ORDER — SOD CITRATE-CITRIC ACID 500-334 MG/5ML PO SOLN
ORAL | Status: AC
  Administered 2021-11-26: 30 mL
  Filled 2021-11-26: qty 15

## 2021-11-26 MED ORDER — PHENYLEPHRINE 80 MCG/ML (10ML) SYRINGE FOR IV PUSH (FOR BLOOD PRESSURE SUPPORT)
PREFILLED_SYRINGE | INTRAVENOUS | Status: AC
  Filled 2021-11-26: qty 10

## 2021-11-26 MED ORDER — KETOROLAC TROMETHAMINE 30 MG/ML IJ SOLN
30.0000 mg | Freq: Four times a day (QID) | INTRAMUSCULAR | Status: AC | PRN
  Administered 2021-11-26 – 2021-11-27 (×3): 30 mg via INTRAVENOUS
  Filled 2021-11-26 (×2): qty 1

## 2021-11-26 MED ORDER — PRENATAL MULTIVITAMIN CH
1.0000 | ORAL_TABLET | Freq: Every day | ORAL | Status: DC
Start: 2021-11-27 — End: 2021-11-28
  Administered 2021-11-27 – 2021-11-28 (×2): 1 via ORAL
  Filled 2021-11-26 (×2): qty 1

## 2021-11-26 MED ORDER — ONDANSETRON HCL 4 MG/2ML IJ SOLN
INTRAMUSCULAR | Status: DC | PRN
  Administered 2021-11-26: 4 mg via INTRAVENOUS

## 2021-11-26 MED ORDER — FENTANYL CITRATE (PF) 100 MCG/2ML IJ SOLN
INTRAMUSCULAR | Status: DC | PRN
  Administered 2021-11-26: 15 ug via INTRATHECAL

## 2021-11-26 MED ORDER — CHLORHEXIDINE GLUCONATE 0.12 % MT SOLN
15.0000 mL | Freq: Once | OROMUCOSAL | Status: AC
  Administered 2021-11-26: 15 mL via OROMUCOSAL
  Filled 2021-11-26: qty 15

## 2021-11-26 MED ORDER — BUPIVACAINE-MELOXICAM ER 400-12 MG/14ML IJ SOLN
INTRAMUSCULAR | Status: AC
  Filled 2021-11-26: qty 1

## 2021-11-26 MED ORDER — PROMETHAZINE HCL 25 MG/ML IJ SOLN: 6.2500 mg | INTRAMUSCULAR | Status: DC | PRN

## 2021-11-26 MED ORDER — OXYTOCIN-SODIUM CHLORIDE 30-0.9 UT/500ML-% IV SOLN
INTRAVENOUS | Status: DC | PRN
  Administered 2021-11-26: 300 mL via INTRAVENOUS

## 2021-11-26 MED ORDER — PHENYLEPHRINE 80 MCG/ML (10ML) SYRINGE FOR IV PUSH (FOR BLOOD PRESSURE SUPPORT)
PREFILLED_SYRINGE | INTRAVENOUS | Status: DC | PRN
  Administered 2021-11-26 (×2): 80 ug via INTRAVENOUS

## 2021-11-26 MED ORDER — LACTATED RINGERS IV SOLN
INTRAVENOUS | Status: DC
Start: 2021-11-26 — End: 2021-11-28

## 2021-11-26 MED ORDER — SODIUM CHLORIDE 0.9% FLUSH: 3.0000 mL | INTRAVENOUS | Status: DC | PRN

## 2021-11-26 MED ORDER — BUPIVACAINE IN DEXTROSE 0.75-8.25 % IT SOLN
INTRATHECAL | Status: DC | PRN
  Administered 2021-11-26: 1.6 mL via INTRATHECAL

## 2021-11-26 MED ORDER — CEFAZOLIN SODIUM-DEXTROSE 2-4 GM/100ML-% IV SOLN
2.0000 g | INTRAVENOUS | Status: AC
  Administered 2021-11-26: 2 g via INTRAVENOUS
  Filled 2021-11-26: qty 100

## 2021-11-26 MED ORDER — FENTANYL CITRATE (PF) 100 MCG/2ML IJ SOLN: 25.0000 ug | INTRAMUSCULAR | Status: DC | PRN

## 2021-11-26 MED ORDER — BUPIVACAINE-MELOXICAM ER 400-12 MG/14ML IJ SOLN: 400.0000 mg | Freq: Once | INTRAMUSCULAR | Status: DC

## 2021-11-26 MED ORDER — POVIDONE-IODINE 10 % EX SWAB
2.0000 | Freq: Once | CUTANEOUS | Status: AC
  Administered 2021-11-26: 2 via TOPICAL
  Filled 2021-11-26: qty 2

## 2021-11-26 MED ORDER — EPHEDRINE SULFATE-NACL 50-0.9 MG/10ML-% IV SOSY
PREFILLED_SYRINGE | INTRAVENOUS | Status: DC | PRN
  Administered 2021-11-26: 10 mg via INTRAVENOUS

## 2021-11-26 MED ORDER — DIPHENHYDRAMINE HCL 25 MG PO CAPS
25.0000 mg | ORAL_CAPSULE | Freq: Four times a day (QID) | ORAL | Status: DC | PRN
  Administered 2021-11-26: 25 mg via ORAL
  Filled 2021-11-26: qty 1

## 2021-11-26 SURGICAL SUPPLY — 30 items
ADHESIVE MASTISOL STRL (MISCELLANEOUS) ×2 IMPLANT
BACTOSHIELD CHG 4% 4OZ (MISCELLANEOUS) ×1
BAG COUNTER SPONGE SURGICOUNT (BAG) ×2 IMPLANT
CHLORAPREP W/TINT 26 (MISCELLANEOUS) ×4 IMPLANT
DRSG TELFA 3X8 NADH (GAUZE/BANDAGES/DRESSINGS) ×2 IMPLANT
GAUZE SPONGE 4X4 12PLY STRL (GAUZE/BANDAGES/DRESSINGS) ×2 IMPLANT
GLOVE BIOGEL PI ORTHO PRO 7.5 (GLOVE) ×1
GLOVE PI ORTHO PRO STRL 7.5 (GLOVE) ×1 IMPLANT
GOWN STRL REUS W/ TWL LRG LVL3 (GOWN DISPOSABLE) ×2 IMPLANT
GOWN STRL REUS W/TWL LRG LVL3 (GOWN DISPOSABLE) ×2
KIT TURNOVER KIT A (KITS) ×2 IMPLANT
MANIFOLD NEPTUNE II (INSTRUMENTS) ×2 IMPLANT
MAT PREVALON FULL STRYKER (MISCELLANEOUS) ×2 IMPLANT
NS IRRIG 1000ML POUR BTL (IV SOLUTION) ×2 IMPLANT
PACK C SECTION AR (MISCELLANEOUS) ×2 IMPLANT
PAD DRESSING TELFA 3X8 NADH (GAUZE/BANDAGES/DRESSINGS) ×1 IMPLANT
PAD OB MATERNITY 4.3X12.25 (PERSONAL CARE ITEMS) ×2 IMPLANT
PAD PREP 24X41 OB/GYN DISP (PERSONAL CARE ITEMS) ×2 IMPLANT
RETRACTOR WND ALEXIS-O 25 LRG (MISCELLANEOUS) ×1 IMPLANT
RETRACTOR WOUND ALXS 18CM SML (MISCELLANEOUS) IMPLANT
RTRCTR WOUND ALEXIS O 18CM SML (MISCELLANEOUS) ×2
RTRCTR WOUND ALEXIS O 25CM LRG (MISCELLANEOUS) ×2
SCRUB CHG 4% DYNA-HEX 4OZ (MISCELLANEOUS) ×1 IMPLANT
SPONGE T-LAP 18X18 ~~LOC~~+RFID (SPONGE) ×2 IMPLANT
SUT VIC AB 0 CTX 36 (SUTURE) ×3
SUT VIC AB 0 CTX36XBRD ANBCTRL (SUTURE) ×2 IMPLANT
SUT VIC AB 1 CT1 36 (SUTURE) ×5 IMPLANT
SUT VICRYL 3-0 27IN (SUTURE) ×1 IMPLANT
SUT VICRYL+ 3-0 36IN CT-1 (SUTURE) ×4 IMPLANT
WATER STERILE IRR 500ML POUR (IV SOLUTION) ×2 IMPLANT

## 2021-11-26 NOTE — Op Note (Signed)
      OP NOTE  Date: 11/26/2021   2:00 PM Name Linda Thornton MR# [id number]  Preoperative Diagnosis: 1. Intrauterine pregnancy at 110w2d 2.  Cholestasis of pregnancy 3.  Grade 3 placenta 4.  H/O 3 prior CDs   Postoperative Diagnosis: 1. Intrauterine pregnancy at [redacted]w[redacted]d, delivered 2. Viable infant 3. Remainder same as pre-op   Procedure: 1. Repeat Low-Transverse Cesarean Section  Surgeon: Elonda Husky, MD  Assistant:  Dondra Prader CNM  Anesthesia: Spinal    EBL: 850  ml     Findings: 1) female infant, Apgar scores of 8   at 1 minute and 9   at 5 minutes and a birthweight of 97  ounces.    2) Normal uterus, tubes and ovaries.   3) Omental adhesions through the fascia   Procedure:  The patient was prepped and draped in the supine position and placed under spinal anesthesia.  A transverse incision was made across the abdomen in a Pfannenstiel manner. If indicated the old scar was systematically removed with sharp dissection.  We carried the dissection down to the level of the fascia.  The fascia was incised in a curvilinear manner.  The fascia was then elevated from the rectus muscles with blunt and sharp dissection.  Numerous adhesions of the omentum were encountered and systematically lysed. The rectus muscles were separated laterally exposing the peritoneum.  The peritoneum was carefully entered with care being taken to avoid bowel and bladder.  A self-retaining retractor was placed.  The visceral peritoneum was incised in a curvilinear fashion across the lower uterine segment creating a bladder flap. A transverse incision was made across the lower uterine segment and extended laterally and superiorly using the bandage scissors.  Artificial rupture membranes was performed and copiousClear fluid was noted.  The infant was delivered from the cephalic position.  A nuchal cord was not present. After an appropriate time interval, the cord was doubly clamped and cut. Cord blood was  obtained if required.  The infant was handed to the pediatric personnel  who then placed the infant under heat lamps where it was cleaned dried and suctioned as needed. The placenta was delivered. The hysterotomy incision was then identified on ring forceps.  The uterine cavity was cleaned with a moist lap sponge.  The hysterotomy incision was closed with a running interlocking suture of Vicryl followed by and imbricating stitch. Hemostasis was excellent.  Pitocin was run in the IV and the uterus was found to be firm. The posterior cul-de-sac and gutters were cleaned and inspected.  Hemostasis was noted.  The fascia was then closed with a running suture of #1 Vicryl.  Hemostasis of the subcutaneous tissues was obtained using the Bovie.  The subcutaneous tissues were closed with a running suture of 000 Vicryl.  A subcuticular suture was placed.  Steri-strips were applied in the usual manner.  A pressure dressing was placed.  The patient went to the recovery room in stable condition.   Elonda Husky, M.D. 11/26/2021 2:00 PM

## 2021-11-26 NOTE — Anesthesia Preprocedure Evaluation (Signed)
Anesthesia Evaluation  Patient identified by MRN, date of birth, ID band Patient awake    Reviewed: Allergy & Precautions, H&P , NPO status , Patient's Chart, lab work & pertinent test results, reviewed documented beta blocker date and time   History of Anesthesia Complications Negative for: history of anesthetic complications  Airway Mallampati: I  TM Distance: >3 FB Neck ROM: full    Dental  (+) Dental Advidsory Given, Teeth Intact   Pulmonary neg shortness of breath, neg COPD, neg recent URI, Current Smoker,    Pulmonary exam normal breath sounds clear to auscultation       Cardiovascular Exercise Tolerance: Good negative cardio ROS Normal cardiovascular exam Rhythm:regular Rate:Normal     Neuro/Psych PSYCHIATRIC DISORDERS Anxiety Bipolar Disorder negative neurological ROS     GI/Hepatic Neg liver ROS, GERD  ,  Endo/Other  negative endocrine ROS  Renal/GU negative Renal ROS  negative genitourinary   Musculoskeletal   Abdominal   Peds  Hematology negative hematology ROS (+)   Anesthesia Other Findings Past Medical History: No date: Anxiety No date: Bipolar 1 disorder (HCC) No date: Bulging lumbar disc No date: Bursitis No date: History of kidney stones No date: Migraine headache No date: Renal disorder     Comment:  kidney stone No date: Substance abuse (HCC)     Comment:  cocaine, clonazapam, hydrocodone, oxycodone 04/2013: Trichomonal vulvovaginitis   Reproductive/Obstetrics negative OB ROS                             Anesthesia Physical Anesthesia Plan  ASA: 2  Anesthesia Plan: Spinal   Post-op Pain Management:    Induction:   PONV Risk Score and Plan:   Airway Management Planned: Natural Airway and Nasal Cannula  Additional Equipment:   Intra-op Plan:   Post-operative Plan:   Informed Consent: I have reviewed the patients History and Physical, chart, labs  and discussed the procedure including the risks, benefits and alternatives for the proposed anesthesia with the patient or authorized representative who has indicated his/her understanding and acceptance.     Dental Advisory Given  Plan Discussed with: Anesthesiologist, CRNA and Surgeon  Anesthesia Plan Comments:         Anesthesia Quick Evaluation

## 2021-11-26 NOTE — Anesthesia Procedure Notes (Signed)
Spinal  Patient location during procedure: OR Start time: 11/26/2021 11:48 AM End time: 11/26/2021 11:50 AM Reason for block: surgical anesthesia Staffing Performed: resident/CRNA  Anesthesiologist: Martha Clan, MD Resident/CRNA: Aline Brochure, CRNA Performed by: Aline Brochure, CRNA Authorized by: Martha Clan, MD   Preanesthetic Checklist Completed: patient identified, IV checked, site marked, risks and benefits discussed, surgical consent, monitors and equipment checked, pre-op evaluation and timeout performed Spinal Block Patient position: sitting Prep: ChloraPrep Patient monitoring: heart rate, continuous pulse ox, blood pressure and cardiac monitor Approach: midline Location: L3-4 Injection technique: single-shot Needle Needle type: Introducer and Pencan  Needle gauge: 24 G Needle length: 9 cm Assessment Sensory level: T10 Events: CSF return Additional Notes Sterile aseptic technique used throughout the procedure.  Negative paresthesia. Negative blood return. Positive free-flowing CSF. Expiration date of kit checked and confirmed. Patient tolerated procedure well, without complications.

## 2021-11-26 NOTE — Progress Notes (Signed)
Patient observed sniffing [place] in a water bottle from home. Patient states that is what she was smelling and it is a "quirk" of hers. MD advised.

## 2021-11-26 NOTE — Transfer of Care (Signed)
Immediate Anesthesia Transfer of Care Note  Patient: Linda Thornton  Procedure(s) Performed: CESAREAN SECTION REPEAT  Patient Location: LDR6  Anesthesia Type:Spinal  Level of Consciousness: awake, alert  and oriented  Airway & Oxygen Therapy: Patient Spontanous Breathing  Post-op Assessment: Report given to RN and Post -op Vital signs reviewed and stable  Post vital signs: Reviewed and stable  Last Vitals:  Vitals Value Taken Time  BP 110/82   Temp 36.5 C 11/26/21 1339  Pulse 88   Resp 12   SpO2 99     Last Pain:  Vitals:   11/26/21 1339  TempSrc: Oral  PainSc:          Complications: No notable events documented.

## 2021-11-27 ENCOUNTER — Encounter: Payer: Self-pay | Admitting: Obstetrics and Gynecology

## 2021-11-27 LAB — CBC
HCT: 27.9 % — ABNORMAL LOW (ref 36.0–46.0)
Hemoglobin: 9.4 g/dL — ABNORMAL LOW (ref 12.0–15.0)
MCH: 31.4 pg (ref 26.0–34.0)
MCHC: 33.7 g/dL (ref 30.0–36.0)
MCV: 93.3 fL (ref 80.0–100.0)
Platelets: 221 10*3/uL (ref 150–400)
RBC: 2.99 MIL/uL — ABNORMAL LOW (ref 3.87–5.11)
RDW: 13.3 % (ref 11.5–15.5)
WBC: 18.7 10*3/uL — ABNORMAL HIGH (ref 4.0–10.5)
nRBC: 0 % (ref 0.0–0.2)

## 2021-11-27 LAB — SYPHILIS: RPR W/REFLEX TO RPR TITER AND TREPONEMAL ANTIBODIES, TRADITIONAL SCREENING AND DIAGNOSIS ALGORITHM: RPR Ser Ql: NONREACTIVE

## 2021-11-27 LAB — STREP GP B NAA: Strep Gp B NAA: POSITIVE — AB

## 2021-11-27 NOTE — Anesthesia Postprocedure Evaluation (Signed)
Anesthesia Post Note  Patient: Linda Thornton  Procedure(s) Performed: CESAREAN SECTION REPEAT  Patient location during evaluation: Mother Baby Anesthesia Type: Spinal Level of consciousness: oriented and awake and alert Pain management: pain level controlled Vital Signs Assessment: post-procedure vital signs reviewed and stable Respiratory status: spontaneous breathing and respiratory function stable Cardiovascular status: blood pressure returned to baseline and stable Postop Assessment: no headache, no backache, no apparent nausea or vomiting and able to ambulate Anesthetic complications: no   No notable events documented.   Last Vitals:  Vitals:   11/27/21 0600 11/27/21 0747  BP:  (!) 106/56  Pulse: 83 78  Resp:  20  Temp:  36.9 C  SpO2: 97% 96%    Last Pain:  Vitals:   11/27/21 0747  TempSrc: Oral  PainSc:                  Karoline Caldwell

## 2021-11-27 NOTE — Anesthesia Post-op Follow-up Note (Signed)
  Anesthesia Pain Follow-up Note  Patient: Linda Thornton  Day #: 1  Date of Follow-up: 11/27/2021 Time: 8:25 AM  Last Vitals:  Vitals:   11/27/21 0600 11/27/21 0747  BP:  (!) 106/56  Pulse: 83 78  Resp:  20  Temp:  36.9 C  SpO2: 97% 96%    Level of Consciousness: alert  Pain: none   Side Effects:None  Catheter Site Exam:clean, dry, no drainage     Plan: D/C from anesthesia care at surgeon's request  Karoline Caldwell

## 2021-11-27 NOTE — TOC Initial Note (Signed)
Transition of Care [facility]) - Initial/Assessment Note    Patient Details  Name: Linda Thornton MRN: [id number] Date of Birth: 1991-02-06  Transition of Care [facility] CM/SW Contact:    Shelbie Hutching, RN Phone Number: 11/27/2021, 11:52 AM  Clinical Narrative:                 RNCM met with patient at the bedside.  Patient is awake, alert, engaged and breast feeding newborn baby boy- Hildred Priest.  Patients significant other also at the bedside, Vanita Ingles.  They live together in a house in [place] with her 3 children, Earnestine Mealing is the 4 child.  All of her kids have been going to [facility] but when she got her new [employer] card it had [facility] listed as pediatrician.  She is going to call to see if Jefm Bryant has availability, she wants to keep all of her children at the same pediatrician. Patient reports having all needed items for baby, car seat, bassinet, clothes, diapers.  She has reliable transportation.  The only thing she reports she does not have is a breast pump.  Patient's information sent to [employer] to see if a breast pump is covered, if it is it will be delivered to the patient's room.  TOC consult for newborn- drug exposed newborn.  Urine drug screen is negative on newborn.  Cord blood has been sent and TOC will follow for cord blood results.    Mother reports using over the counter THC/CBD products for pain during pregnancy.  Denies any other substance use.           Patient Goals and CMS Choice        Expected Discharge Plan and Services                                                Prior Living Arrangements/Services                       Activities of Daily Living Home Assistive Devices/Equipment: None ADL Screening (condition at time of admission) Patient's cognitive ability adequate to safely complete daily activities?: Yes Is the patient deaf or have difficulty hearing?: No Does the patient have difficulty seeing, even  when wearing glasses/contacts?: No Does the patient have difficulty concentrating, remembering, or making decisions?: Yes Patient able to express need for assistance with ADLs?: No Does the patient have difficulty dressing or bathing?: No Independently performs ADLs?: Yes (appropriate for developmental age) Does the patient have difficulty walking or climbing stairs?: Yes Weakness of Legs: None Weakness of Arms/Hands: None  Permission Sought/Granted                  Emotional Assessment              Admission diagnosis:  Normal pregnancy [Z34.90] Patient Active Problem List   Diagnosis Date Noted   Normal pregnancy 11/26/2021   Postmature abnormal placenta in third trimester 11/15/2021   Gastroesophageal reflux during pregnancy in third trimester, antepartum 09/26/2021   History of postpartum hemorrhage, currently pregnant 08/01/2021   Ovarian cyst, right 08/15/2020   Tobacco abuse 04/27/2016   Overweight (BMI 25.0-29.9) 04/27/2016   History of 3 cesarean sections 09/11/2014   Sedative, hypnotic or anxiolytic use disorder, severe, 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

## 2021-11-28 MED ORDER — OXYCODONE-ACETAMINOPHEN 5-325 MG PO TABS: 2.0000 | ORAL_TABLET | ORAL | Status: DC | PRN

## 2021-11-28 MED ORDER — IBUPROFEN 600 MG PO TABS: 600.0000 mg | ORAL_TABLET | Freq: Four times a day (QID) | ORAL | 0 refills | Status: AC

## 2021-11-28 MED ORDER — ONDANSETRON HCL 4 MG PO TABS: 4.0000 mg | ORAL_TABLET | Freq: Three times a day (TID) | ORAL | Status: DC | PRN

## 2021-11-28 MED ORDER — OXYCODONE-ACETAMINOPHEN 5-325 MG PO TABS: 2.0000 | ORAL_TABLET | ORAL | 0 refills | Status: DC | PRN

## 2021-11-28 NOTE — Progress Notes (Signed)
RN noticed what appeared to be a THC vape on her bedside table. CNM and Neo notified. Neo to talk to patient about risks. Florencia Reasons, RN

## 2021-11-28 NOTE — Progress Notes (Signed)
Pt discharged with infant. Discharge instructions, prescriptions, and follow up appointments given to and reviewed with patient. Pt verbalized understanding. Escorted out by auxillary.  

## 2021-11-28 NOTE — Discharge Summary (Cosign Needed)
Postpartum Discharge Summary  Date of Service updated:  11/28/2021     Patient Name: Linda Thornton DOB: Aug 04, 1990 MRN: [id number]  Date of admission: 11/26/2021 Delivery date:11/26/2021  Delivering provider: Harlin Heys  Date of discharge: 11/28/2021  Admitting diagnosis: Normal pregnancy [Z34.90] Intrauterine pregnancy: [redacted]w[redacted]d    Secondary diagnosis:  Principal Problem:   Normal pregnancy Active Problems:   Postpartum care following cesarean delivery  Additional problems: anemia post delivery    Discharge diagnosis: Term Pregnancy Delivered                                              Post partum procedures: NA Augmentation: N/A Complications: None  Hospital course: Sceduled C/S   31y.o. yo [id number] 322w2das admitted to the hospital 11/26/2021 for scheduled cesarean section with the following indication:Elective Repeat.Delivery details are as follows:  Membrane Rupture Time/Date: 12:33 PM ,11/26/2021   Delivery Method:C-Section, Low Transverse  Details of operation can be found in separate operative note.  Patient had an uncomplicated postpartum course.  She is ambulating, tolerating a regular diet, passing flatus, and urinating well. Patient is discharged home in stable condition on  11/28/21        Newborn Data: Birth date:11/26/2021  Birth time:12:35 PM  Gender:Female  Living status:Living  Apgars:8 ,9  Weight:2750 g     Magnesium Sulfate received: No BMZ received: No Rhophylac:N/A MMR:No T-DaP:Given postpartum Flu: No Transfusion:No  Physical exam  Vitals:   11/27/21 0747 11/27/21 1627 11/28/21 0000 11/28/21 0833  BP: (!) 106/56 112/76 129/81 124/82  Pulse: 78 85 73 74  Resp: '20 18 18 20  ' Temp: 98.4 F (36.9 C) 98.3 F (36.8 C) 97.8 F (36.6 C) 98.4 F (36.9 C)  TempSrc: Oral Oral Oral Oral  SpO2: 96% 98% 97% 98%  Weight:      Height:       General: cooperative and no distress Lochia: appropriate Uterine Fundus: firm Incision: Healing  well with no significant drainage, Dressing is clean, dry, and intact DVT Evaluation: No evidence of DVT seen on physical exam. Negative Homan's sign. No significant calf/ankle edema. Labs: Lab Results  Component Value Date   WBC 18.7 (H) 11/27/2021   HGB 9.4 (L) 11/27/2021   HCT 27.9 (L) 11/27/2021   MCV 93.3 11/27/2021   PLT 221 11/27/2021      Latest Ref Rng & Units 05/11/2016    2:45 PM  CMP  Glucose 65 - 99 mg/dL 89   BUN 6 - 20 mg/dL 9   Creatinine 0.44 - 1.00 mg/dL 0.55   Sodium 135 - 145 mmol/L 136   Potassium 3.5 - 5.1 mmol/L 3.3   Chloride 101 - 111 mmol/L 108   CO2 22 - 32 mmol/L 21   Calcium 8.9 - 10.3 mg/dL 8.3    Edinburgh Score:    11/27/2021   10:49 AM  Edinburgh Postnatal Depression Scale Screening Tool  I have been able to laugh and see the funny side of things. 0  I have looked forward with enjoyment to things. 1  I have blamed myself unnecessarily when things went wrong. 2  I have been anxious or worried for no good reason. 2  I have felt scared or panicky for no good reason. 2  Things have been getting on top of me. 1  I have  been so unhappy that I have had difficulty sleeping. 1  I have felt sad or miserable. 0  I have been so unhappy that I have been crying. 0  The thought of harming myself has occurred to me. 0  Edinburgh Postnatal Depression Scale Total 9      After visit meds:  Allergies as of 11/28/2021       Reactions   Vicodin Hp [hydrocodone-acetaminophen] Itching   Hydrocodone-acetaminophen Itching   Latex Rash        Medication List     STOP taking these medications    ALPRAZolam 1 MG tablet Commonly known as: XANAX   amphetamine-dextroamphetamine 30 MG 24 hr capsule Commonly known as: ADDERALL XR   lisdexamfetamine 20 MG capsule Commonly known as: VYVANSE   ursodiol 500 MG tablet Commonly known as: ACTIGALL       TAKE these medications    hydrOXYzine 25 MG tablet Commonly known as: ATARAX Take 1 tablet (25  mg total) by mouth every 6 (six) hours as needed for itching.   ibuprofen 600 MG tablet Commonly known as: ADVIL Take 1 tablet (600 mg total) by mouth every 6 (six) hours.   oxyCODONE-acetaminophen 5-325 MG tablet Commonly known as: PERCOCET/ROXICET Take 2 tablets by mouth every 4 (four) hours as needed for moderate pain or severe pain. Start taking on: November 29, 2021   pantoprazole 20 MG tablet Commonly known as: Protonix Take 1 tablet (20 mg total) by mouth 2 (two) times daily before a meal.   PRENATAL PO Take by mouth.               Durable Medical Equipment  (From admission, onward)           Start     Ordered   11/27/21 1212  For home use only DME double electric breast pump  Once       Comments: ICD 10 code Z39.1   11/27/21 1214              Discharge Care Instructions  (From admission, onward)           Start     Ordered   11/28/21 0000  Leave dressing on - Keep it clean, dry, and intact until clinic visit        11/28/21 1137   11/28/21 0000  Leave dressing on - Keep it clean, dry, and intact until clinic visit        11/28/21 1145   11/28/21 0000  Leave dressing on - Keep it clean, dry, and intact until clinic visit        11/28/21 1150             Discharge home in stable condition Infant Feeding: Breast Infant Disposition:home with mother Discharge instruction: per After Visit Summary and Postpartum booklet. Activity: Advance as tolerated. Pelvic rest for 6 weeks.  Diet: routine diet Anticipated Birth Control: POPs, Unsure, and or may consider another method. Postpartum Appointment:1 week Additional Postpartum F/U: Incision check 1 week Future Appointments:No future appointments. Follow up Visit:To see Dr. Amalia Hailey in one week for an incision check.      11/28/2021 Imagene Riches, CNM

## 2021-12-02 NOTE — H&P (Signed)
History and Physical   HPI  Linda Thornton is a 31 y.o. [id number] at [redacted]w[redacted]d Estimated Date of Delivery: 12/15/21 who is being admitted for C-section.H/O cholestasis during this pregnancy and an advanced placenta noted to be grade 3 which has been present for several weeks.  This is a repeat cesarean delivery.   OB History  OB History  Gravida Para Term Preterm AB Living  4 4 4  0 0 4  SAB IAB Ectopic Multiple Live Births  0 0 0 0 4    # Outcome Date GA Lbr Len/2nd Weight Sex Delivery Anes PTL Lv  4 Term 11/26/21 [redacted]w[redacted]d  2750 g M CS-LTranv Spinal  LIV     Name: Sahni,BOY Encarnacion     Apgar1: 8  Apgar5: 9  3 Term 10/31/16   3629 g M CS-Unspec  N LIV  2 Term 10/12/14 [redacted]w[redacted]d  3345 g M CS-Unspec  N LIV  1 Term 05/26/09 [redacted]w[redacted]d  3345 g M CS-LTranv  N LIV     Complications: Fetal Intolerance, Thick meconium stained amniotic fluid     Apgar1: 8  Apgar5: 9    Obstetric Comments  G1 - C-section for FITL/ FTP (only dilated 1.5 cm, failed IOL).   G2- performed for repeat only    PROBLEM LIST  Pregnancy complications or risks: Patient Active Problem List   Diagnosis Date Noted   Postpartum care following cesarean delivery 11/28/2021   Normal pregnancy 11/26/2021   Ovarian cyst, right 08/15/2020   Tobacco abuse 04/27/2016   Overweight (BMI 25.0-29.9) 04/27/2016   History of 3 cesarean sections 09/11/2014   Sedative, hypnotic or anxiolytic use disorder, severe, in early remission (HCC) 07/13/2014   Adjustment disorder with anxiety 11/15/2013   Back pain 03/09/2012   Fibromyalgia 09/09/2011    Prenatal labs and studies: ABO, Rh: --/--/A POS (06/19 1001) Antibody: NEG (06/19 1001) Rubella: 1.93 (12/22 1656) RPR: NON REACTIVE (06/19 1001)  HBsAg: Negative (12/22 1656)  HIV: Non Reactive (12/22 1656)  AVW:UJWJXBJY/-- (06/15 1402)   Past Medical History:  Diagnosis Date   Anxiety    Bipolar 1 disorder (HCC)    Bulging lumbar disc    Bursitis    History of kidney stones     Migraine headache    Renal disorder    kidney stone   Substance abuse (HCC)    cocaine, clonazapam, hydrocodone, oxycodone   Trichomonal vulvovaginitis 04/2013     Past Surgical History:  Procedure Laterality Date   CESAREAN SECTION     2010, 2016, 2018   CESAREAN SECTION N/A 11/26/2021   Procedure: CESAREAN SECTION REPEAT;  Surgeon: Linzie Collin, MD;  Location: [facility] ORS;  Service: Obstetrics;  Laterality: N/A;     Medications    Discharge Medication List as of 11/28/2021 12:45 PM     START taking these medications   Details  ibuprofen (ADVIL) 600 MG tablet Take 1 tablet (600 mg total) by mouth every 6 (six) hours., Starting Wed 11/28/2021, Normal    oxyCODONE-acetaminophen (PERCOCET/ROXICET) 5-325 MG tablet Take 2 tablets by mouth every 4 (four) hours as needed for moderate pain or severe pain., Starting Thu 11/29/2021, Print       CONTINUE these medications which have NOT CHANGED   Details  pantoprazole (PROTONIX) 20 MG tablet Take 1 tablet (20 mg total) by mouth 2 (two) times daily before a meal., Starting Wed 09/26/2021, Normal    Prenatal Vit-Fe Fumarate-FA (PRENATAL PO) Take by mouth., Historical Med  hydrOXYzine (ATARAX) 25 MG tablet Take 1 tablet (25 mg total) by mouth every 6 (six) hours as needed for itching., Starting Wed 10/17/2021, Normal       STOP taking these medications     ALPRAZolam (XANAX) 1 MG tablet Comments:  Reason for Stopping:       amphetamine-dextroamphetamine (ADDERALL XR) 30 MG 24 hr capsule Comments:  Reason for Stopping:       lisdexamfetamine (VYVANSE) 20 MG capsule Comments:  Reason for Stopping:       ursodiol (ACTIGALL) 500 MG tablet Comments:  Reason for Stopping:           Allergies  Vicodin hp [hydrocodone-acetaminophen], Hydrocodone-acetaminophen, and Latex  Review of Systems  Pertinent items noted in HPI and remainder of comprehensive ROS otherwise negative.  Physical Exam  BP 124/82 (BP Location: Left  Arm)   Pulse 74   Temp 98.4 F (36.9 C) (Oral)   Resp 20   Ht 5\' 3"  (1.6 m)   Wt 81 kg   LMP 03/10/2021 (Approximate)   SpO2 98%   Breastfeeding Unknown   BMI 31.63 kg/m   Lungs:  CTA B Cardio: RRR without M/R/G Abd: Soft, gravid, NT Presentation: cephalic EXT: No C/C/ 1+ Edema DTRs: 2+ B CERVIX:    See Prenatal records for more detailed PE.     FHR:  Variability: Good {> 6 bpm)  Toco: Uterine Contractions: None  Test Results  No results found for this or any previous visit (from the past 24 hour(s)).   Assessment   [id number] at [redacted]w[redacted]d Estimated Date of Delivery: 12/15/21  The fetus is reassuring.   Patient Active Problem List   Diagnosis Date Noted   Postpartum care following cesarean delivery 11/28/2021   Normal pregnancy 11/26/2021   Ovarian cyst, right 08/15/2020   Tobacco abuse 04/27/2016   Overweight (BMI 25.0-29.9) 04/27/2016   History of 3 cesarean sections 09/11/2014   Sedative, hypnotic or anxiolytic use disorder, severe, in early remission (HCC) 07/13/2014   Adjustment disorder with anxiety 11/15/2013   Back pain 03/09/2012   Fibromyalgia 09/09/2011    Plan  1. Admit to L&D :   2. EFM: -- Category 1 3. Cesarean delivery for cholestasis  Elonda Husky, M.D. 12/02/2021 11:44 AM

## 2021-12-05 ENCOUNTER — Ambulatory Visit (INDEPENDENT_AMBULATORY_CARE_PROVIDER_SITE_OTHER): Payer: Medicaid Other | Admitting: Obstetrics and Gynecology

## 2021-12-05 ENCOUNTER — Encounter: Payer: Self-pay | Admitting: Obstetrics and Gynecology

## 2021-12-05 DIAGNOSIS — Z9889 Other specified postprocedural states: Secondary | ICD-10-CM

## 2021-12-05 NOTE — Progress Notes (Signed)
HPI:      [name] is a 31 y.o. [phone] who LMP was No LMP recorded.  Subjective:   She presents today 1 week from cesarean delivery.  She reports that she is breast-feeding full-time.  She is complaining of some gas pains and some difficulty with bowel movements.  She says she is passing gas but it is sometimes difficult.  Urinating without problem.  Pain controlled.    Hx: The following portions of the patient's history were reviewed and updated as appropriate:             She  has a past medical history of Anxiety, Bipolar 1 disorder (HCC), Bulging lumbar disc, Bursitis, History of kidney stones, Migraine headache, Renal disorder, Substance abuse (HCC), and Trichomonal vulvovaginitis (04/2013). She does not have any pertinent problems on file. She  has a past surgical history that includes Cesarean section and Cesarean section (N/A, 11/26/2021). Her family history includes Diabetes in her maternal aunt and maternal grandfather; Heart disease in her maternal grandfather; Hyperlipidemia in her father; Hypertension in her father and mother; Irregular heart beat in her mother; Migraines in her mother; Osteoarthritis in her maternal grandfather and maternal grandmother; Other (age of onset: 50) in her brother; Polycystic ovary syndrome in her sister; Rheum arthritis in her maternal grandfather and maternal grandmother. She  reports that she has been smoking cigarettes. She has been smoking an average of .25 packs per day. She has never used smokeless tobacco. She reports that she does not drink alcohol and does not use drugs. She has a current medication list which includes the following prescription(s): hydroxyzine, ibuprofen, oxycodone-acetaminophen, pantoprazole, and prenatal vit-fe fumarate-fa. She is allergic to vicodin hp [hydrocodone-acetaminophen], hydrocodone-acetaminophen, and latex.       Review of Systems:  Review of Systems  Constitutional: Denied constitutional symptoms,  night sweats, recent illness, fatigue, fever, insomnia and weight loss.  Eyes: Denied eye symptoms, eye pain, photophobia, vision change and visual disturbance.  Ears/Nose/Throat/Neck: Denied ear, nose, throat or neck symptoms, hearing loss, nasal discharge, sinus congestion and sore throat.  Cardiovascular: Denied cardiovascular symptoms, arrhythmia, chest pain/pressure, edema, exercise intolerance, orthopnea and palpitations.  Respiratory: Denied pulmonary symptoms, asthma, pleuritic pain, productive sputum, cough, dyspnea and wheezing.  Gastrointestinal: Denied, gastro-esophageal reflux, melena, nausea and vomiting.  Genitourinary: Denied genitourinary symptoms including symptomatic vaginal discharge, pelvic relaxation issues, and urinary complaints.  Musculoskeletal: Denied musculoskeletal symptoms, stiffness, swelling, muscle weakness and myalgia.  Dermatologic: Denied dermatology symptoms, rash and scar.  Neurologic: Denied neurology symptoms, dizziness, headache, neck pain and syncope.  Psychiatric: Denied psychiatric symptoms, anxiety and depression.  Endocrine: Denied endocrine symptoms including hot flashes and night sweats.   Meds:   Current Outpatient Medications on File Prior to Visit  Medication Sig Dispense Refill   hydrOXYzine (ATARAX) 25 MG tablet Take 1 tablet (25 mg total) by mouth every 6 (six) hours as needed for itching. 30 tablet 2   ibuprofen (ADVIL) 600 MG tablet Take 1 tablet (600 mg total) by mouth every 6 (six) hours. 30 tablet 0   oxyCODONE-acetaminophen (PERCOCET/ROXICET) 5-325 MG tablet Take 2 tablets by mouth every 4 (four) hours as needed for moderate pain or severe pain. 30 tablet 0   pantoprazole (PROTONIX) 20 MG tablet Take 1 tablet (20 mg total) by mouth 2 (two) times daily before a meal. 60 tablet 2   Prenatal Vit-Fe Fumarate-FA (PRENATAL PO) Take by mouth.     No current facility-administered medications on file prior to visit.  Objective:      Vitals:   12/05/21 0813  BP: 117/78  Pulse: 85   There were no vitals filed for this visit.             Abdomen: Soft.  Non-tender.  No masses.  No HSM.  Incision/s: Intact.  Healing well.  No erythema.  No drainage.  Honeycomb intact             Assessment:    [id number] Patient Active Problem List   Diagnosis Date Noted   Postpartum care following cesarean delivery 11/28/2021   Normal pregnancy 11/26/2021   Ovarian cyst, right 08/15/2020   Tobacco abuse 04/27/2016   Overweight (BMI 25.0-29.9) 04/27/2016   History of 3 cesarean sections 09/11/2014   Sedative, hypnotic or anxiolytic use disorder, severe, in early remission (HCC) 07/13/2014   Adjustment disorder with anxiety 11/15/2013   Back pain 03/09/2012   Fibromyalgia 09/09/2011     1. Postpartum care following cesarean delivery   2. Status post surgery     Patient generally doing well with the exception of gas pain and some constipation.   Plan:            1.  Discussed use of MiraLAX, Gas-X, Citrucel.  She will try these.  2.  Patient will remove honeycomb dressing in the next 2 days.  Wound may stay open to air. Orders No orders of the defined types were placed in this encounter.   No orders of the defined types were placed in this encounter.     F/U  Return in about 4 weeks (around 01/02/2022).  Elonda Husky, M.D. 12/05/2021 8:27 AM

## 2021-12-05 NOTE — Progress Notes (Signed)
Patient presents for cesarean delivery incision check. She states no trouble with incision. Patient does report gas pains and trouble with BM, trying Gas-X and Miralax with no relief. She is breastfeeding. No other questions at this time.

## 2021-12-07 ENCOUNTER — Telehealth: Payer: Self-pay

## 2021-12-07 NOTE — TOC CM/SW Note (Signed)
Urine drug screen was negative, mother was not tested at admission.  Cord Blood has resulted and it was positive for  Amphetamine, Cord, Qual Cutoff 5 ng/g Present Abnormal    Methamphetamine,Cord,Qual Cutoff 5 ng/g Present Abnormal    Benzoylecgonine, Cord, Qual Cutoff 0.5 ng/g NOT DETECTED   m-OH-Benzoylecgonine,Cord,Ql Cutoff 1 ng/g NOT DETECTED   Cocaethylene, Cord, Qual Cutoff 1 ng/g NOT DETECTED   Cocaine, Cord, Qual Cutoff 0.5 ng/g NOT DETECTED   MDMA-Ecstasy, Cord, Qual Cutoff 5 ng/g NOT DETECTED   Phentermine, Cord, Qual Cutoff 8 ng/g NOT DETECTED   Alprazolam, Cord, Qual Cutoff 0.5 ng/g Present Abnormal    Alpha-OH-Alprazolam, Cord,Ql Cutoff 0.5 ng/g Present Abnormal    CPS report was made today 12/07/21.

## 2021-12-07 NOTE — TOC Progression Note (Deleted)
Transition of Care [facility]) - Progression Note    Patient Details  Name: Linda Thornton MRN: [id number] Date of Birth: [birth year]  [facility] CM/SW Contact  Allayne Butcher, RN Phone Number: 12/07/2021, 4:52 PM  Clinical Narrative:    Additional follow up call placed to patient's CPS worker Alfredo Martinez [phone]- Cord blood is back and was positive for Cocaine, Fentanyl, and  Benzoylecgonine, Cord, Qual Cutoff 0.5 ng/g Present Abnormal    m-OH-Benzoylecgonine,Cord,Ql Cutoff 1 ng/g Present Abnormal             Expected Discharge Plan and Services           Expected Discharge Date: 11/28/21                                     Social Determinants of Health (SDOH) Interventions    Readmission Risk Interventions     No data to display

## 2021-12-15 ENCOUNTER — Inpatient Hospital Stay (HOSPITAL_COMMUNITY): Admit: 2021-12-15 | Payer: Self-pay

## 2022-01-02 ENCOUNTER — Encounter: Payer: Self-pay | Admitting: Obstetrics and Gynecology

## 2022-01-02 ENCOUNTER — Ambulatory Visit (INDEPENDENT_AMBULATORY_CARE_PROVIDER_SITE_OTHER): Payer: Medicaid Other | Admitting: Obstetrics and Gynecology

## 2022-01-02 DIAGNOSIS — Z30011 Encounter for initial prescription of contraceptive pills: Secondary | ICD-10-CM

## 2022-01-02 MED ORDER — NORETHINDRONE 0.35 MG PO TABS: 1.0000 | ORAL_TABLET | Freq: Every day | ORAL | 11 refills | Status: AC

## 2022-01-02 NOTE — Progress Notes (Signed)
Patient presents today for 6 week postpartum follow-up. She had a cesarean delivery on 6/19. Patient is breastfeeding. She states she would like OCP's for birth control. EPDS score of 5. Patient states no other questions or concerns at this time.

## 2022-01-02 NOTE — Progress Notes (Signed)
HPI:      [name] is a 31 y.o. [phone] who LMP was No LMP recorded.  Subjective:   She presents today approximately 6 weeks postpartum.  She is breast-feeding full-time.  She would like to take OCPs for birth control-specifically Micronor.  After she discontinues breast-feeding she is planning to go back to NuvaRing. Her issues with constipation and gas pains from last visit have resolved using Citrucel.  She said this worked very quickly and she was better in 2 to 3 days.    Hx: The following portions of the patient's history were reviewed and updated as appropriate:             She  has a past medical history of Anxiety, Bipolar 1 disorder (HCC), Bulging lumbar disc, Bursitis, History of kidney stones, Migraine headache, Renal disorder, Substance abuse (HCC), and Trichomonal vulvovaginitis (04/2013). She does not have any pertinent problems on file. She  has a past surgical history that includes Cesarean section and Cesarean section (N/A, 11/26/2021). Her family history includes Diabetes in her maternal aunt and maternal grandfather; Heart disease in her maternal grandfather; Hyperlipidemia in her father; Hypertension in her father and mother; Irregular heart beat in her mother; Migraines in her mother; Osteoarthritis in her maternal grandfather and maternal grandmother; Other (age of onset: 86) in her brother; Polycystic ovary syndrome in her sister; Rheum arthritis in her maternal grandfather and maternal grandmother. She  reports that she has been smoking cigarettes. She has been smoking an average of .25 packs per day. She has never used smokeless tobacco. She reports that she does not drink alcohol and does not use drugs. She has a current medication list which includes the following prescription(s): ibuprofen, norethindrone, pantoprazole, and prenatal vit-fe fumarate-fa. She is allergic to vicodin hp [hydrocodone-acetaminophen], hydrocodone-acetaminophen, and latex.       Review  of Systems:  Review of Systems  Constitutional: Denied constitutional symptoms, night sweats, recent illness, fatigue, fever, insomnia and weight loss.  Eyes: Denied eye symptoms, eye pain, photophobia, vision change and visual disturbance.  Ears/Nose/Throat/Neck: Denied ear, nose, throat or neck symptoms, hearing loss, nasal discharge, sinus congestion and sore throat.  Cardiovascular: Denied cardiovascular symptoms, arrhythmia, chest pain/pressure, edema, exercise intolerance, orthopnea and palpitations.  Respiratory: Denied pulmonary symptoms, asthma, pleuritic pain, productive sputum, cough, dyspnea and wheezing.  Gastrointestinal: Denied, gastro-esophageal reflux, melena, nausea and vomiting.  Genitourinary: Denied genitourinary symptoms including symptomatic vaginal discharge, pelvic relaxation issues, and urinary complaints.  Musculoskeletal: Denied musculoskeletal symptoms, stiffness, swelling, muscle weakness and myalgia.  Dermatologic: Denied dermatology symptoms, rash and scar.  Neurologic: Denied neurology symptoms, dizziness, headache, neck pain and syncope.  Psychiatric: Denied psychiatric symptoms, anxiety and depression.  Endocrine: Denied endocrine symptoms including hot flashes and night sweats.   Meds:   Current Outpatient Medications on File Prior to Visit  Medication Sig Dispense Refill   ibuprofen (ADVIL) 600 MG tablet Take 1 tablet (600 mg total) by mouth every 6 (six) hours. 30 tablet 0   pantoprazole (PROTONIX) 20 MG tablet Take 1 tablet (20 mg total) by mouth 2 (two) times daily before a meal. 60 tablet 2   Prenatal Vit-Fe Fumarate-FA (PRENATAL PO) Take by mouth.     No current facility-administered medications on file prior to visit.      Objective:     There were no vitals filed for this visit. There were no vitals filed for this visit.    Abdomen: Soft.  Non-tender.  No masses.  No  HSM.  Incision/s: Intact.  Healing well.  No erythema.  No drainage.              Pelvic examination   Pelvic:   Vulva: Normal appearance.  No lesions.  No abnormal scarring.    Vagina: No lesions or abnormalities noted.  Support: Normal pelvic support.  Urethra No masses tenderness or scarring.  Meatus Normal size without lesions or prolapse.  Cervix: Normal ectropion.  No lesions.  Anus: Normal exam.  No lesions.  Perineum: Normal exam.  No lesions.  Healed well.          Bimanual   Uterus: Normal size.  Non-tender.  Mobile.  AV.  Adnexae: No masses.  Non-tender to palpation.  Cul-de-sac: Negative for abnormality.               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 Postpartum care following cesarean delivery   2. Initiation of oral contraception     Patient doing well postop and postpartum.  No issues   Plan:            1.  Desires Micronor for birth control.  Instructed in its use.  2.  May resume normal activities with exception of heavy lifting. Orders No orders of the defined types were placed in this encounter.    Meds ordered this encounter  Medications   norethindrone (MICRONOR) 0.35 MG tablet    Sig: Take 1 tablet (0.35 mg total) by mouth daily.    Dispense:  28 tablet    Refill:  11      F/U  Return in about 4 months (around 05/05/2022) for Annual Physical.  Elonda Husky, M.D. 01/02/2022 2:43 PM

## 2022-03-20 ENCOUNTER — Encounter: Payer: Self-pay | Admitting: Obstetrics and Gynecology

## 2022-03-22 ENCOUNTER — Encounter: Payer: Self-pay | Admitting: Obstetrics and Gynecology

## 2022-05-08 ENCOUNTER — Encounter: Payer: Medicaid Other | Admitting: Obstetrics and Gynecology

## 2022-05-08 DIAGNOSIS — Z01419 Encounter for gynecological examination (general) (routine) without abnormal findings: Secondary | ICD-10-CM

## 2022-12-16 ENCOUNTER — Other Ambulatory Visit: Payer: Self-pay

## 2022-12-16 ENCOUNTER — Emergency Department
Admission: EM | Admit: 2022-12-16 | Discharge: 2022-12-16 | Discharge status: Against Medical Advice | Payer: MEDICAID | Attending: Emergency Medicine | Admitting: Emergency Medicine

## 2022-12-16 ENCOUNTER — Encounter: Payer: Self-pay | Admitting: Emergency Medicine

## 2022-12-16 DIAGNOSIS — T43621A Poisoning by amphetamines, accidental (unintentional), initial encounter: Secondary | ICD-10-CM | POA: Insufficient documentation

## 2022-12-16 DIAGNOSIS — Z5321 Procedure and treatment not carried out due to patient leaving prior to being seen by health care provider: Secondary | ICD-10-CM | POA: Insufficient documentation

## 2022-12-16 DIAGNOSIS — T424X1A Poisoning by benzodiazepines, accidental (unintentional), initial encounter: Secondary | ICD-10-CM | POA: Diagnosis not present

## 2022-12-16 LAB — URINE DRUG SCREEN, QUALITATIVE (ARMC ONLY)
Amphetamines, Ur Screen: POSITIVE — AB
Barbiturates, Ur Screen: NOT DETECTED
Benzodiazepine, Ur Scrn: POSITIVE — AB
Cannabinoid 50 Ng, Ur ~~LOC~~: NOT DETECTED
Cocaine Metabolite,Ur ~~LOC~~: NOT DETECTED
MDMA (Ecstasy)Ur Screen: NOT DETECTED
Methadone Scn, Ur: NOT DETECTED
Opiate, Ur Screen: NOT DETECTED
Phencyclidine (PCP) Ur S: NOT DETECTED
Tricyclic, Ur Screen: NOT DETECTED

## 2022-12-16 LAB — POC URINE PREG, ED: Preg Test, Ur: NEGATIVE

## 2022-12-16 MED ORDER — ONDANSETRON 4 MG PO TBDP
4.0000 mg | ORAL_TABLET | Freq: Once | ORAL | Status: AC
  Administered 2022-12-16: 4 mg via ORAL

## 2022-12-16 MED ORDER — ONDANSETRON 4 MG PO TBDP
ORAL_TABLET | ORAL | Status: AC
  Filled 2022-12-16: qty 1

## 2022-12-16 NOTE — ED Notes (Signed)
Pt continues to not be visualized in the lobby at this time. Not visualized in the bathroom or outside.
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
## 2022-12-16 NOTE — ED Notes (Signed)
Pt initially visualized in the bathroom for long period of time, pt noted to be sitting on the toilet talking on the phone. This RN notified by security pt visualized walking out lobby, ambulatory without difficulty. Pt not visualized in the bathroom, not visualized in the lobby, not visualized outside of the lobby.

## 2023-05-13 IMAGING — US US PELVIS COMPLETE WITH TRANSVAGINAL
1 series · 13 of 25 positions shown · non-contrast
Comparison: 08/08/2020 CT

CLINICAL DATA: Irregular menses and pelvic pain, initial encounter



[Series 1: us pelvic complete with transvaginal · 13 of 109 slices shown]
[im 1/109]
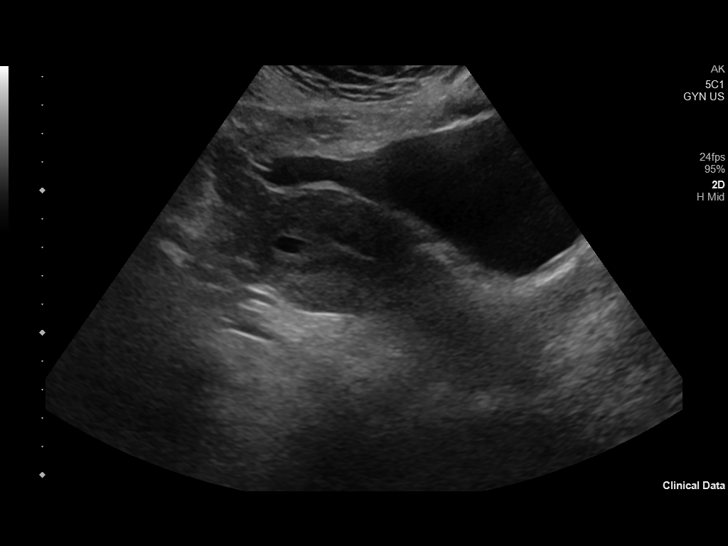
[im 10/109]
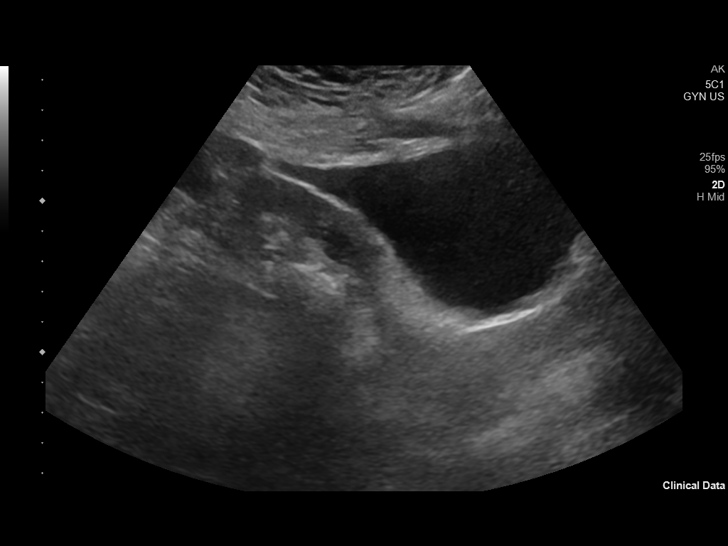
[im 19/109]
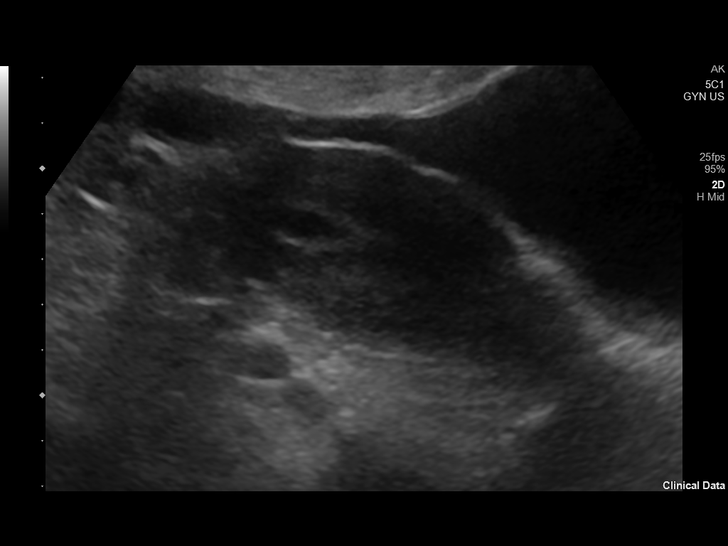
[im 28/109]
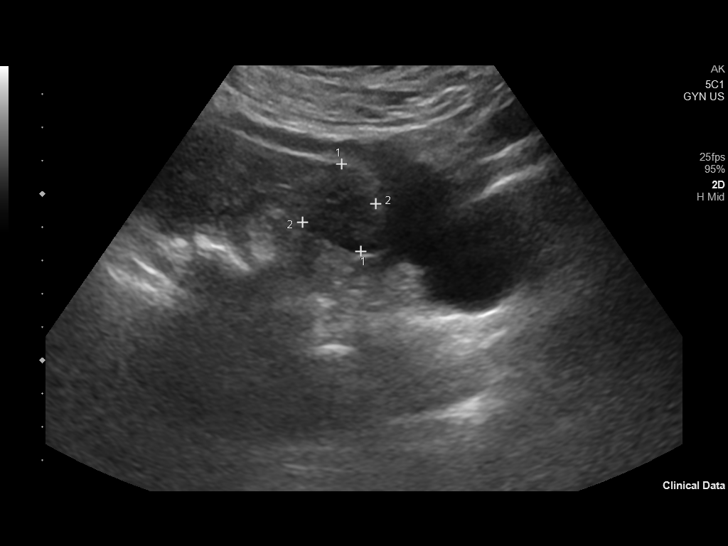
[im 37/109]
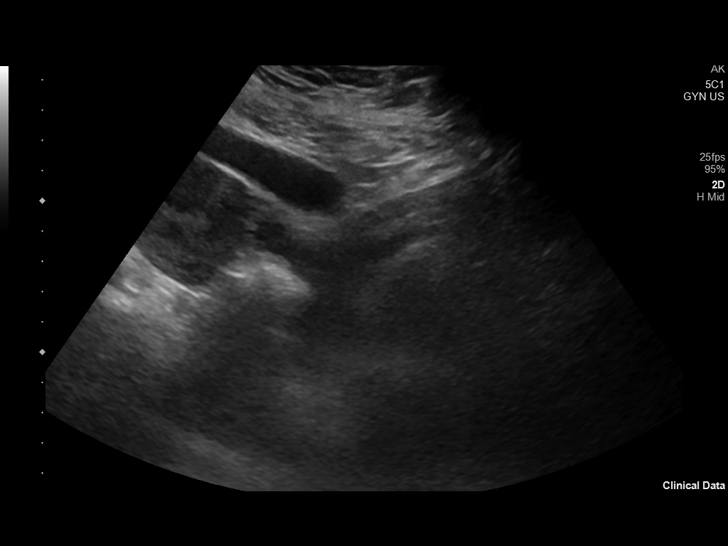
[im 46/109]
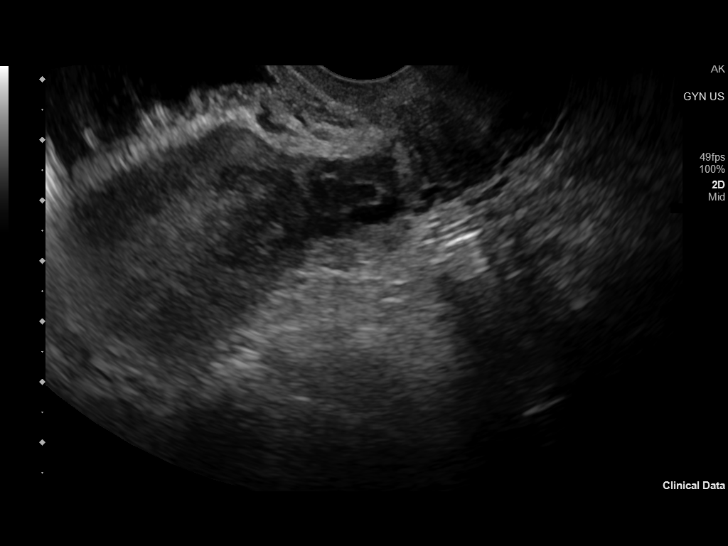
[im 55/109]
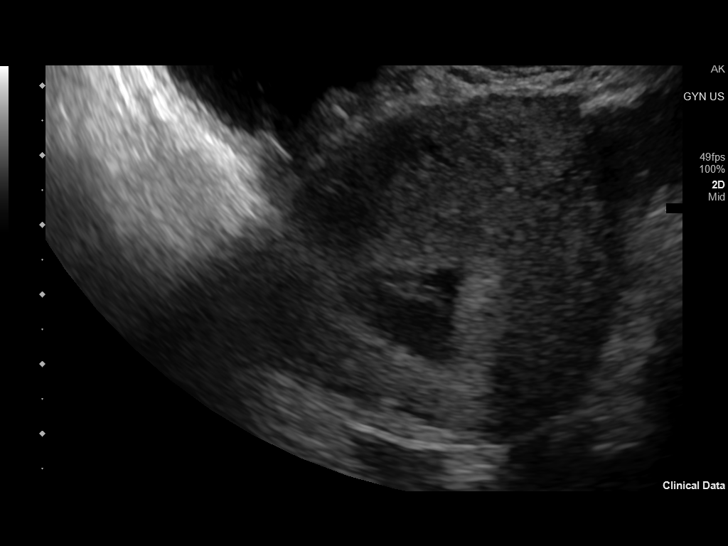
[im 64/109]
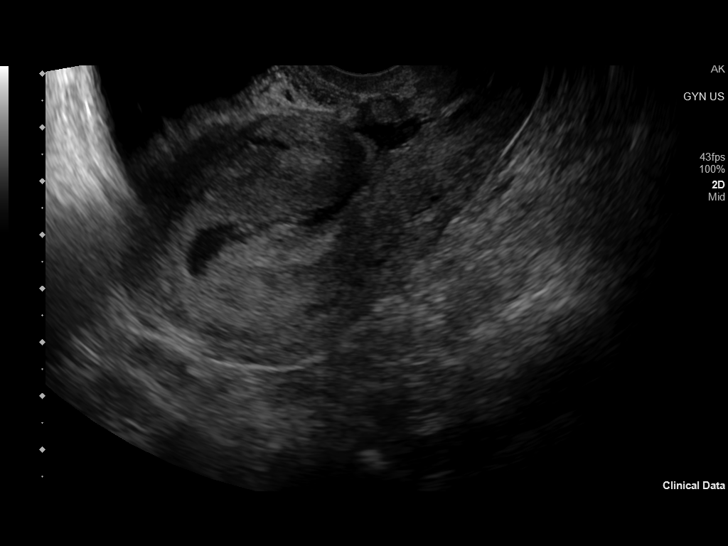
[im 73/109]
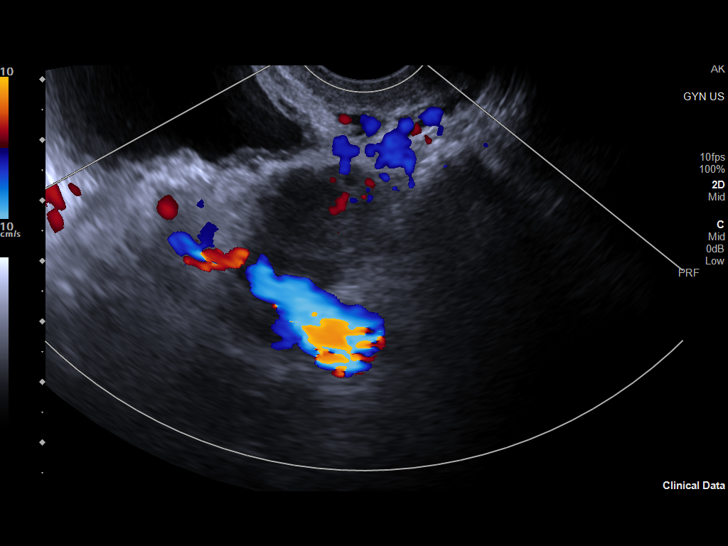
[im 82/109]
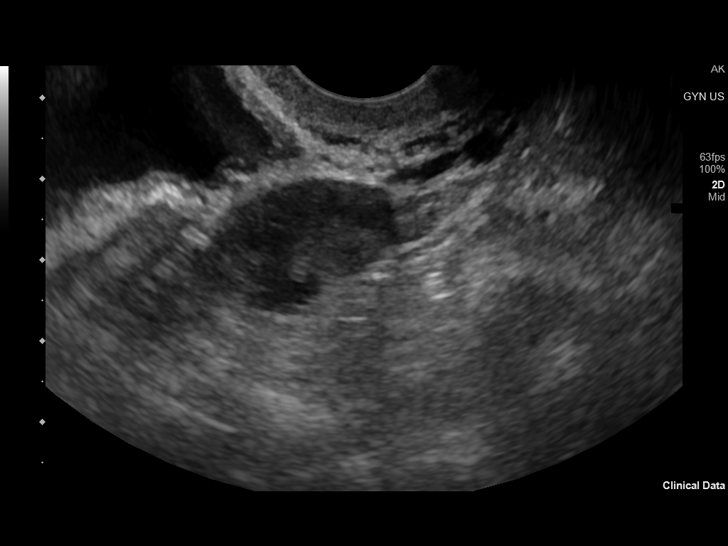
[im 91/109]
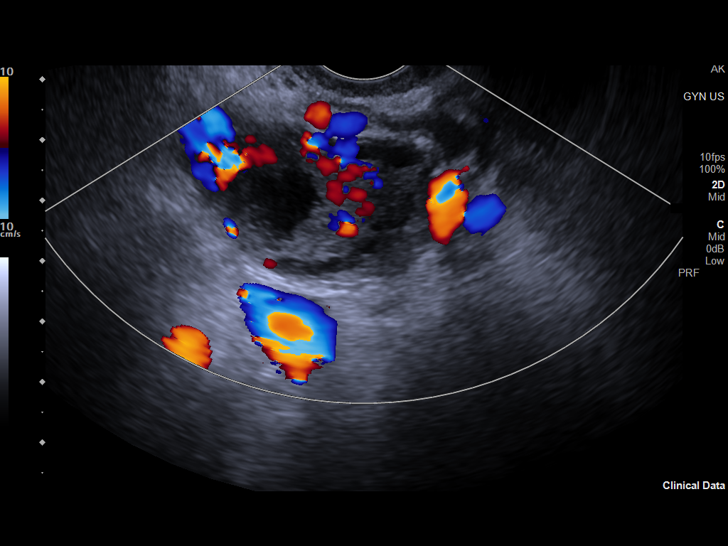
[im 100/109]
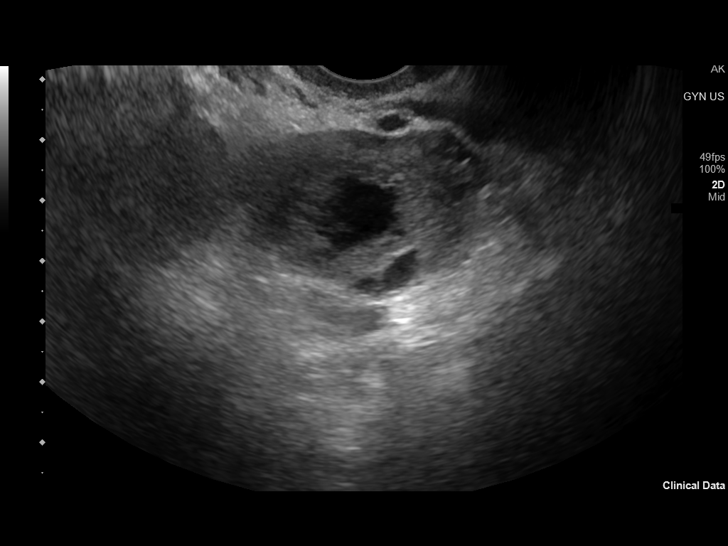
[im 109/109]
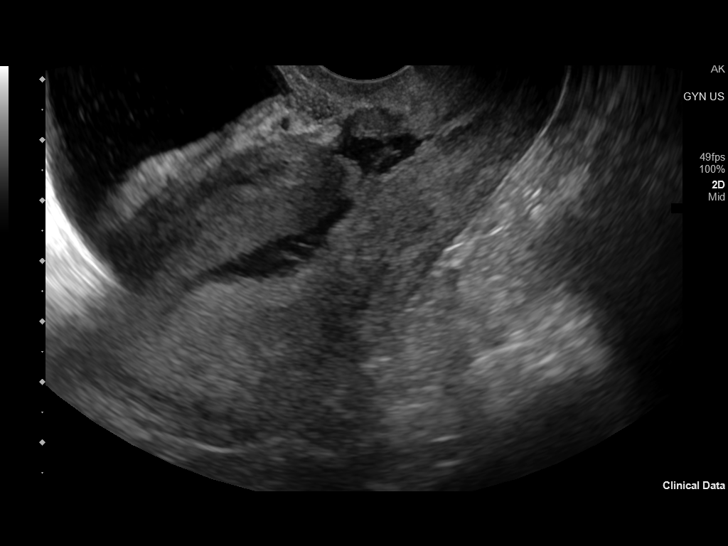

[13 of 25 positions shown; findings below may reference images not displayed]

FINDINGS: Uterus

Measurements: 9.3 x 4.6 x 4.9 cm. = volume: 111 mL. No fibroids or
other mass visualized. Prior Caesarean defect is noted.

Endometrium

Thickness: 8 mm.  Mild fluid is noted within the endometrial canal.

Right ovary

Measurements: 2.2 x 2.2 x 1.3 cm. = volume: 3.4 mL. Normal
appearance/no adnexal mass. Previously seen right ovarian cyst has
resolved in the interval.

Left ovary

Measurements: 3.8 x 3.3 x 2.5 cm. = volume: 17 mL. 15 mm left
ovarian cyst is noted.

Other findings

No abnormal free fluid.
IMPRESSION: Mild fluid within the endometrial canal.

Small left ovarian cyst. Previously seen right ovarian cyst has
resolved in the interval. No followup imaging recommended. Note:
This recommendation does not apply to premenarchal patients or to
those with increased risk (genetic, family history, elevated tumor
markers or other high-risk factors) of ovarian cancer. Reference:
Radiology [DATE]):[id number].

## 2024-07-15 ENCOUNTER — Other Ambulatory Visit: Payer: Self-pay | Admitting: Medical Genetics

## 2024-07-16 ENCOUNTER — Encounter (INDEPENDENT_AMBULATORY_CARE_PROVIDER_SITE_OTHER): Payer: Self-pay

## 2024-07-17 ENCOUNTER — Other Ambulatory Visit: Payer: Self-pay
